# Patient Record
Sex: Female | Born: 1940
Health system: Southern US, Community
[De-identification: ages and names within clinical notes are randomized; demographics above are authoritative.]

## PROBLEM LIST (undated history)

## (undated) DIAGNOSIS — I351 Nonrheumatic aortic (valve) insufficiency: Secondary | ICD-10-CM

## (undated) DIAGNOSIS — M810 Age-related osteoporosis without current pathological fracture: Secondary | ICD-10-CM

## (undated) DIAGNOSIS — E785 Hyperlipidemia, unspecified: Secondary | ICD-10-CM

## (undated) DIAGNOSIS — K5790 Diverticulosis of intestine, part unspecified, without perforation or abscess without bleeding: Secondary | ICD-10-CM

## (undated) DIAGNOSIS — N814 Uterovaginal prolapse, unspecified: Secondary | ICD-10-CM

## (undated) DIAGNOSIS — E7800 Pure hypercholesterolemia, unspecified: Secondary | ICD-10-CM

## (undated) DIAGNOSIS — I10 Essential (primary) hypertension: Secondary | ICD-10-CM

## (undated) HISTORY — PX: ABDOMINAL HYSTERECTOMY: SHX81

## (undated) HISTORY — DX: Hyperlipidemia, unspecified: E78.5

## (undated) HISTORY — DX: Nonrheumatic aortic (valve) insufficiency: I35.1

## (undated) HISTORY — DX: Uterovaginal prolapse, unspecified: N81.4

## (undated) HISTORY — DX: Diverticulosis of intestine, part unspecified, without perforation or abscess without bleeding: K57.90

## (undated) HISTORY — DX: Age-related osteoporosis without current pathological fracture: M81.0

## (undated) HISTORY — DX: Essential (primary) hypertension: I10

## (undated) HISTORY — PX: BACK SURGERY: SHX140

---

## 2005-01-21 ENCOUNTER — Ambulatory Visit: Payer: Self-pay | Admitting: Gastroenterology

## 2005-02-10 ENCOUNTER — Ambulatory Visit: Payer: Self-pay | Admitting: Gastroenterology

## 2005-02-21 ENCOUNTER — Encounter: Admission: RE | Admit: 2005-02-21 | Discharge: 2005-02-21 | Payer: Self-pay | Admitting: Family Medicine

## 2005-05-30 ENCOUNTER — Ambulatory Visit (HOSPITAL_COMMUNITY): Admission: RE | Admit: 2005-05-30 | Discharge: 2005-05-31 | Payer: Self-pay | Admitting: Obstetrics and Gynecology

## 2006-04-14 ENCOUNTER — Encounter: Admission: RE | Admit: 2006-04-14 | Discharge: 2006-04-14 | Payer: Self-pay | Admitting: Family Medicine

## 2006-04-30 ENCOUNTER — Encounter: Admission: RE | Admit: 2006-04-30 | Discharge: 2006-04-30 | Payer: Self-pay | Admitting: Family Medicine

## 2006-07-12 ENCOUNTER — Inpatient Hospital Stay (HOSPITAL_COMMUNITY): Admission: RE | Admit: 2006-07-12 | Discharge: 2006-07-15 | Payer: Self-pay | Admitting: Orthopaedic Surgery

## 2010-04-18 ENCOUNTER — Encounter: Payer: Self-pay | Admitting: Family Medicine

## 2010-04-19 ENCOUNTER — Encounter: Payer: Self-pay | Admitting: Family Medicine

## 2010-12-05 ENCOUNTER — Emergency Department (HOSPITAL_COMMUNITY)
Admission: EM | Admit: 2010-12-05 | Discharge: 2010-12-05 | Disposition: A | Discharge status: Home / Self Care | Payer: PRIVATE HEALTH INSURANCE | Attending: Emergency Medicine | Admitting: Emergency Medicine

## 2010-12-05 DIAGNOSIS — E785 Hyperlipidemia, unspecified: Secondary | ICD-10-CM | POA: Insufficient documentation

## 2010-12-05 DIAGNOSIS — R112 Nausea with vomiting, unspecified: Secondary | ICD-10-CM | POA: Insufficient documentation

## 2010-12-05 DIAGNOSIS — I10 Essential (primary) hypertension: Secondary | ICD-10-CM | POA: Insufficient documentation

## 2010-12-05 DIAGNOSIS — R42 Dizziness and giddiness: Secondary | ICD-10-CM | POA: Insufficient documentation

## 2010-12-05 DIAGNOSIS — R197 Diarrhea, unspecified: Secondary | ICD-10-CM | POA: Insufficient documentation

## 2010-12-05 DIAGNOSIS — K5289 Other specified noninfective gastroenteritis and colitis: Secondary | ICD-10-CM | POA: Insufficient documentation

## 2010-12-05 LAB — CBC
HCT: 33 % — ABNORMAL LOW (ref 36.0–46.0)
Hemoglobin: 11.4 g/dL — ABNORMAL LOW (ref 12.0–15.0)
MCH: 29.6 pg (ref 26.0–34.0)
MCHC: 34.5 g/dL (ref 30.0–36.0)
MCV: 85.7 fL (ref 78.0–100.0)
RDW: 14.2 % (ref 11.5–15.5)
WBC: 9.8 10*3/uL (ref 4.0–10.5)

## 2010-12-05 LAB — COMPREHENSIVE METABOLIC PANEL
ALT: 29 U/L (ref 0–35)
Albumin: 4.4 g/dL (ref 3.5–5.2)
Alkaline Phosphatase: 71 U/L (ref 39–117)
BUN: 16 mg/dL (ref 6–23)
CO2: 28 mEq/L (ref 19–32)
Calcium: 9.7 mg/dL (ref 8.4–10.5)
Chloride: 105 mEq/L (ref 96–112)
Creatinine, Ser: 0.65 mg/dL (ref 0.50–1.10)
GFR calc Af Amer: >60 mL/min (ref >60)
GFR calc non Af Amer: >60 mL/min (ref >60)
Glucose, Bld: 116 mg/dL — ABNORMAL HIGH (ref 70–99)
Total Bilirubin: 0.5 mg/dL (ref 0.3–1.2)
Total Protein: 7.1 g/dL (ref 6.0–8.3)

## 2010-12-05 LAB — DIFFERENTIAL
Basophils Absolute: 0 10*3/uL (ref 0.0–0.1)
Eosinophils Absolute: 0 10*3/uL (ref 0.0–0.7)
Eosinophils Relative: 0 % (ref 0–5)
Lymphocytes Relative: 8 % — ABNORMAL LOW (ref 12–46)
Lymphs Abs: 0.8 10*3/uL (ref 0.7–4.0)
Monocytes Absolute: 0.4 10*3/uL (ref 0.1–1.0)
Monocytes Relative: 4 % (ref 3–12)
Neutro Abs: 8.5 10*3/uL — ABNORMAL HIGH (ref 1.7–7.7)

## 2010-12-05 LAB — URINALYSIS, ROUTINE W REFLEX MICROSCOPIC
Bilirubin Urine: NEGATIVE
Glucose, UA: NEGATIVE mg/dL
Hgb urine dipstick: NEGATIVE
Ketones, ur: 15 mg/dL — AB
Nitrite: NEGATIVE
Protein, ur: NEGATIVE mg/dL
Specific Gravity, Urine: 1.016 (ref 1.005–1.030)
pH: 6.5 (ref 5.0–8.0)

## 2010-12-05 LAB — LIPASE, BLOOD: Lipase: 36 U/L (ref 11–59)

## 2010-12-07 LAB — URINE CULTURE
Colony Count: 70000
Culture  Setup Time: 201209092007

## 2012-07-12 ENCOUNTER — Other Ambulatory Visit: Payer: Self-pay | Admitting: Physician Assistant

## 2012-08-17 ENCOUNTER — Other Ambulatory Visit: Payer: Self-pay | Admitting: Family Medicine

## 2012-09-04 ENCOUNTER — Other Ambulatory Visit: Payer: Self-pay | Admitting: Family Medicine

## 2012-11-22 ENCOUNTER — Ambulatory Visit
Admission: RE | Admit: 2012-11-22 | Discharge: 2012-11-22 | Disposition: A | Discharge status: Home / Self Care | Payer: PRIVATE HEALTH INSURANCE | Source: Ambulatory Visit | Attending: Family Medicine | Admitting: Family Medicine

## 2012-11-22 ENCOUNTER — Ambulatory Visit (INDEPENDENT_AMBULATORY_CARE_PROVIDER_SITE_OTHER): Payer: PRIVATE HEALTH INSURANCE | Admitting: Family Medicine

## 2012-11-22 ENCOUNTER — Encounter: Payer: Self-pay | Admitting: Family Medicine

## 2012-11-22 VITALS — BP 100/52 | HR 68 | Temp 98.1°F | Resp 18 | Wt 132.0 lb

## 2012-11-22 DIAGNOSIS — M810 Age-related osteoporosis without current pathological fracture: Secondary | ICD-10-CM | POA: Insufficient documentation

## 2012-11-22 DIAGNOSIS — R918 Other nonspecific abnormal finding of lung field: Secondary | ICD-10-CM

## 2012-11-22 DIAGNOSIS — K5790 Diverticulosis of intestine, part unspecified, without perforation or abscess without bleeding: Secondary | ICD-10-CM | POA: Insufficient documentation

## 2012-11-22 DIAGNOSIS — I10 Essential (primary) hypertension: Secondary | ICD-10-CM

## 2012-11-22 DIAGNOSIS — E785 Hyperlipidemia, unspecified: Secondary | ICD-10-CM | POA: Insufficient documentation

## 2012-11-22 DIAGNOSIS — N814 Uterovaginal prolapse, unspecified: Secondary | ICD-10-CM | POA: Insufficient documentation

## 2012-11-22 LAB — CBC WITH DIFFERENTIAL/PLATELET
Basophils Absolute: 0 10*3/uL (ref 0.0–0.1)
Basophils Relative: 1 % (ref 0–1)
Eosinophils Relative: 2 % (ref 0–5)
HCT: 34.7 % — ABNORMAL LOW (ref 36.0–46.0)
Hemoglobin: 11.5 g/dL — ABNORMAL LOW (ref 12.0–15.0)
Lymphocytes Relative: 27 % (ref 12–46)
Lymphs Abs: 1.3 10*3/uL (ref 0.7–4.0)
MCHC: 33.1 g/dL (ref 30.0–36.0)
MCV: 89.2 fL (ref 78.0–100.0)
Monocytes Absolute: 0.4 10*3/uL (ref 0.1–1.0)
Monocytes Relative: 8 % (ref 3–12)
Neutro Abs: 2.9 10*3/uL (ref 1.7–7.7)
Platelets: 236 10*3/uL (ref 150–400)
RBC: 3.89 MIL/uL (ref 3.87–5.11)
RDW: 14.2 % (ref 11.5–15.5)

## 2012-11-22 LAB — COMPLETE METABOLIC PANEL WITH GFR
AST: 18 U/L (ref 0–37)
Albumin: 4.5 g/dL (ref 3.5–5.2)
Alkaline Phosphatase: 72 U/L (ref 39–117)
BUN: 14 mg/dL (ref 6–23)
CO2: 28 mEq/L (ref 19–32)
Calcium: 9.8 mg/dL (ref 8.4–10.5)
Chloride: 106 mEq/L (ref 96–112)
Creat: 0.97 mg/dL (ref 0.50–1.10)
GFR, Est Non African American: 59 mL/min — ABNORMAL LOW
Glucose, Bld: 92 mg/dL (ref 70–99)
Potassium: 5 mEq/L (ref 3.5–5.3)
Sodium: 141 mEq/L (ref 135–145)
Total Bilirubin: 0.8 mg/dL (ref 0.3–1.2)
Total Protein: 6.8 g/dL (ref 6.0–8.3)

## 2012-11-22 LAB — LIPID PANEL
Cholesterol: 137 mg/dL (ref 0–200)
LDL Cholesterol: 59 mg/dL (ref 0–99)
Total CHOL/HDL Ratio: 2.3 Ratio
Triglycerides: 92 mg/dL (ref <150)
VLDL: 18 mg/dL (ref 0–40)

## 2012-11-22 MED ORDER — BENZONATATE 100 MG PO CAPS
100.0000 mg | ORAL_CAPSULE | Freq: Two times a day (BID) | ORAL | Status: DC | PRN
Start: 2012-11-22 — End: 2014-02-17

## 2012-11-22 NOTE — Progress Notes (Signed)
Subjective:    Patient ID: Julia Robinson, female    DOB: May 21, 1940, 72 y.o.   MRN: [id number]  HPI This is in for followup of her hypertension. She is currently taking diltiazem 120 mg by mouth daily, Cozaar 50 mg by mouth daily, and Lasix 20 mg by mouth when necessary. She denies any chest pain, shortness of breath, dyspnea on exertion. She has hyperlipidemia. She is to lovastatin 20 mg daily. She denies any myalgia or right upper quadrant pain. She does report an occasional cough. Past Medical History  Diagnosis Date  . Hyperlipidemia   . Hypertension   . Diverticulosis   . Osteoporosis   . Prolapsed uterus    Past Surgical History  Procedure Laterality Date  . Abdominal hysterectomy      A+P repair 2007   History   Social History  . Marital Status: Married    Spouse Name: N/A    Number of Children: N/A  . Years of Education: N/A   Occupational History  . Not on file.   Social History Main Topics  . Smoking status: Never Smoker   . Smokeless tobacco: Not on file  . Alcohol Use: No  . Drug Use: No  . Sexual Activity: Not on file   Other Topics Concern  . Not on file   Social History Narrative  . No narrative on file   Family History  Problem Relation Age of Onset  . Heart disease Mother   . Diabetes Sister   . Hypertension Sister   . Hyperlipidemia Sister   . Cancer Maternal Aunt     lung   Current Outpatient Prescriptions on File Prior to Visit  Medication Sig Dispense Refill  . diltiazem (CARDIZEM) 120 MG tablet TAKE ONE TABLET BY MOUTH EVERY DAY  90 tablet  1  . losartan (COZAAR) 50 MG tablet TAKE ONE TABLET BY MOUTH EVERY DAY  30 tablet  5  . piroxicam (FELDENE) 20 MG capsule TAKE ONE CAPSULE BY MOUTH EVERY DAY AS NEEDED FOR PAIN  30 capsule  3   No current facility-administered medications on file prior to visit.   Allergies  Allergen Reactions  . Ace Inhibitors     Cough  . Amlodipine     Swelling  . Codeine   . Crestor [Rosuvastatin]   .  Dramamine [Dimenhydrinate]   . Lipitor [Atorvastatin]     Constipation  . Quinine Derivatives     Hives  . Sulfa Antibiotics       Review of Systems  All other systems reviewed and are negative.       Objective:   Physical Exam  Vitals reviewed. Constitutional: She appears well-developed and well-nourished.  Cardiovascular: Normal rate, regular rhythm, normal heart sounds and intact distal pulses.  Exam reveals no gallop and no friction rub.   No murmur heard. Pulmonary/Chest: Effort normal. No respiratory distress. She has no wheezes. She has rales (RLL). She exhibits no tenderness.  Abdominal: Soft. Bowel sounds are normal. She exhibits no distension and no mass. There is no tenderness. There is no rebound and no guarding.  Musculoskeletal: She exhibits no edema and no tenderness.  Skin: Skin is warm. No rash noted. No erythema. No pallor.          Assessment & Plan:  1. HLD (hyperlipidemia) Check fasting lipid panel. Goal LDL is less than 130. - CBC with Differential - COMPLETE METABOLIC PANEL WITH GFR - Lipid panel  2. Lung field abnormal finding on examination  Given her cough and abnormal finding on her pulmonary examination, I will obtain a chest x-ray. - DG Chest 2 View; Future  3. HTN (hypertension) Blood pressure is excellent. Continue current medications at the present dosages.

## 2013-01-08 ENCOUNTER — Ambulatory Visit (INDEPENDENT_AMBULATORY_CARE_PROVIDER_SITE_OTHER): Payer: PRIVATE HEALTH INSURANCE | Admitting: Family Medicine

## 2013-01-08 DIAGNOSIS — Z23 Encounter for immunization: Secondary | ICD-10-CM

## 2013-02-11 ENCOUNTER — Other Ambulatory Visit: Payer: Self-pay | Admitting: Family Medicine

## 2013-02-25 ENCOUNTER — Other Ambulatory Visit: Payer: Self-pay | Admitting: Family Medicine

## 2013-02-25 NOTE — Telephone Encounter (Signed)
Medication refilled per protocol. 

## 2013-03-12 ENCOUNTER — Other Ambulatory Visit: Payer: Self-pay | Admitting: Family Medicine

## 2013-04-01 ENCOUNTER — Encounter: Payer: Self-pay | Admitting: Family Medicine

## 2013-04-01 ENCOUNTER — Ambulatory Visit (INDEPENDENT_AMBULATORY_CARE_PROVIDER_SITE_OTHER): Payer: Medicare HMO | Admitting: Family Medicine

## 2013-04-01 VITALS — BP 110/64 | HR 74 | Temp 97.2°F | Resp 16 | Wt 130.0 lb

## 2013-04-01 DIAGNOSIS — I10 Essential (primary) hypertension: Secondary | ICD-10-CM

## 2013-04-01 DIAGNOSIS — E785 Hyperlipidemia, unspecified: Secondary | ICD-10-CM

## 2013-04-01 NOTE — Progress Notes (Signed)
Subjective:    Patient ID: Julia Robinson, female    DOB: 12/20/1940, 73 y.o.   MRN: [id number]  HPI  Patient has a history of hypertension and hyperlipidemia. She is currently on diltiazem and losartan for her blood pressure along with Mevacor for cholesterol. She denies any chest pain, shortness of breath, dyspnea on exertion. She denies any myalgias or right upper quadrant pain. She is overdue for fasting lipid panel as well as a CMP to evaluate her liver and her kidneys. She also reports intermittent fever over the last 2 weeks. Her husband has recently been battling the flu. However her fevers predate that. Today she feels well. She is not having a fever. She has no other signs or symptoms of systemic illness. Past Medical History  Diagnosis Date  . Hyperlipidemia   . Hypertension   . Diverticulosis   . Osteoporosis   . Prolapsed uterus    Current Outpatient Prescriptions on File Prior to Visit  Medication Sig Dispense Refill  . aspirin 81 MG tablet Take 81 mg by mouth daily.      . benzonatate (TESSALON) 100 MG capsule Take 1 capsule (100 mg total) by mouth 2 (two) times daily as needed for cough.  30 capsule  0  . diltiazem (CARDIZEM) 120 MG tablet TAKE ONE TABLET BY MOUTH ONCE DAILY  90 tablet  0  . furosemide (LASIX) 20 MG tablet Take 20 mg by mouth as needed.      Marland Kitchen. losartan (COZAAR) 50 MG tablet TAKE ONE TABLET BY MOUTH ONCE DAILY  30 tablet  11  . lovastatin (MEVACOR) 20 MG tablet Take 20 mg by mouth at bedtime.      . piroxicam (FELDENE) 20 MG capsule TAKE ONE CAPSULE BY MOUTH ONCE DAILY AS NEEDED FOR PAIN  30 capsule  0   No current facility-administered medications on file prior to visit.   Allergies  Allergen Reactions  . Ace Inhibitors     Cough  . Amlodipine     Swelling  . Codeine   . Crestor [Rosuvastatin]   . Dramamine [Dimenhydrinate]   . Lipitor [Atorvastatin]     Constipation  . Quinine Derivatives     Hives  . Sulfa Antibiotics    History    Social History  . Marital Status: Married    Spouse Name: N/A    Number of Children: N/A  . Years of Education: N/A   Occupational History  . Not on file.   Social History Main Topics  . Smoking status: Never Smoker   . Smokeless tobacco: Not on file  . Alcohol Use: No  . Drug Use: No  . Sexual Activity: Not on file   Other Topics Concern  . Not on file   Social History Narrative  . No narrative on file     Review of Systems  All other systems reviewed and are negative.       Objective:   Physical Exam  Vitals reviewed. Neck: Neck supple. No JVD present. No thyromegaly present.  Cardiovascular: Normal rate and regular rhythm.  Exam reveals no gallop and no friction rub.   No murmur heard. Pulmonary/Chest: Effort normal and breath sounds normal. No respiratory distress. She has no wheezes. She has no rales. She exhibits no tenderness.  Abdominal: Soft. Bowel sounds are normal. She exhibits no distension and no mass. There is no tenderness. There is no rebound and no guarding.  Musculoskeletal: Normal range of motion. She exhibits no edema.  Lymphadenopathy:    She has no cervical adenopathy.  Neurological: She has normal reflexes. She displays normal reflexes. She exhibits normal muscle tone. Coordination normal.  Skin: Skin is warm and dry. No rash noted. No erythema. No pallor.          Assessment & Plan:  1. HLD (hyperlipidemia) Check fasting lipid panel. LDL is less than 130. - COMPLETE METABOLIC PANEL WITH GFR - Lipid panel - CBC with Differential  2. HTN (hypertension) Pressures currently under control. Continue current medications at the present dosages. Given the subjective intermittent fevers I will also check a CBC. If the patient does have an elevated white blood cell count, I would begin with a workup including a urinalysis, blood cultures, chest x-ray, etc.   Also recommended when the patient feels better to return for Prevnar 13.

## 2013-04-02 ENCOUNTER — Encounter: Payer: Self-pay | Admitting: *Deleted

## 2013-04-02 LAB — CBC WITH DIFFERENTIAL/PLATELET
BASOS PCT: 0 % (ref 0–1)
Basophils Absolute: 0 10*3/uL (ref 0.0–0.1)
EOS PCT: 1 % (ref 0–5)
Eosinophils Absolute: 0.1 10*3/uL (ref 0.0–0.7)
HCT: 37.5 % (ref 36.0–46.0)
Hemoglobin: 12.8 g/dL (ref 12.0–15.0)
Lymphocytes Relative: 28 % (ref 12–46)
Lymphs Abs: 1.5 10*3/uL (ref 0.7–4.0)
MCH: 30.1 pg (ref 26.0–34.0)
MCHC: 34.1 g/dL (ref 30.0–36.0)
MCV: 88.2 fL (ref 78.0–100.0)
Monocytes Absolute: 0.4 10*3/uL (ref 0.1–1.0)
Monocytes Relative: 8 % (ref 3–12)
NEUTROS ABS: 3.2 10*3/uL (ref 1.7–7.7)
Neutrophils Relative %: 63 % (ref 43–77)
Platelets: 268 10*3/uL (ref 150–400)
RBC: 4.25 MIL/uL (ref 3.87–5.11)
RDW: 13.9 % (ref 11.5–15.5)
WBC: 5.2 10*3/uL (ref 4.0–10.5)

## 2013-04-02 LAB — LIPID PANEL
Cholesterol: 163 mg/dL (ref 0–200)
HDL: 65 mg/dL (ref >39)
LDL CALC: 77 mg/dL (ref 0–99)
Total CHOL/HDL Ratio: 2.5 Ratio
Triglycerides: 104 mg/dL (ref <150)
VLDL: 21 mg/dL (ref 0–40)

## 2013-04-02 LAB — COMPLETE METABOLIC PANEL WITH GFR
ALT: 17 U/L (ref 0–35)
AST: 20 U/L (ref 0–37)
Albumin: 4.5 g/dL (ref 3.5–5.2)
Alkaline Phosphatase: 68 U/L (ref 39–117)
BUN: 11 mg/dL (ref 6–23)
CO2: 29 mEq/L (ref 19–32)
Calcium: 9.3 mg/dL (ref 8.4–10.5)
Chloride: 101 mEq/L (ref 96–112)
Creat: 0.68 mg/dL (ref 0.50–1.10)
GFR, Est African American: >89 mL/min
GFR, Est Non African American: 89 mL/min
Glucose, Bld: 88 mg/dL (ref 70–99)
Potassium: 3.5 mEq/L (ref 3.5–5.3)
SODIUM: 141 meq/L (ref 135–145)
TOTAL PROTEIN: 6.7 g/dL (ref 6.0–8.3)
Total Bilirubin: 0.9 mg/dL (ref 0.3–1.2)

## 2013-04-05 ENCOUNTER — Other Ambulatory Visit: Payer: Self-pay | Admitting: Family Medicine

## 2013-04-05 MED ORDER — LOSARTAN POTASSIUM 50 MG PO TABS: ORAL_TABLET | ORAL | Status: DC

## 2013-04-05 MED ORDER — DILTIAZEM HCL 120 MG PO TABS: ORAL_TABLET | ORAL | Status: DC

## 2013-04-05 MED ORDER — LOVASTATIN 20 MG PO TABS: 20.0000 mg | ORAL_TABLET | Freq: Every day | ORAL | Status: DC

## 2013-06-18 ENCOUNTER — Telehealth: Payer: Self-pay | Admitting: *Deleted

## 2013-06-18 MED ORDER — DOXAZOSIN MESYLATE 2 MG PO TABS: 2.0000 mg | ORAL_TABLET | Freq: Every day | ORAL | Status: DC

## 2013-06-18 NOTE — Telephone Encounter (Signed)
Pt states she has been taking Losartan 50mg  and since taking it she has developed muscle cramps, leg cramps, feet hurt and has not been able to sleep good, pt has voluntarily stopped taking losartan and wants to know if you can put her on something else? SHe wanted to remind you that she can not take lisinopril either.

## 2013-06-18 NOTE — Telephone Encounter (Signed)
Stop losartan and start cardura 2 mg poqday.  Recheck BP in 1 month

## 2013-06-18 NOTE — Telephone Encounter (Signed)
Pt is aware of change in medication and script has been sent to pts pharmacy

## 2013-06-25 ENCOUNTER — Telehealth: Payer: Self-pay | Admitting: *Deleted

## 2013-06-25 NOTE — Telephone Encounter (Signed)
Quit losartan and doxazosin and try her on samples of benicar 40 mg poqday

## 2013-06-25 NOTE — Telephone Encounter (Signed)
Pt called stating that since taking medication Doxazosin 2mg , she has been having blurry vision, lighheadness, and dizziness, pt stopped taking the medication but now feels nervous. Pt wants to know if you can prescribe her something else and would like a sample first before picking up script if possible. Pt says she will not take the medication unless you tell her differently.Please advise!

## 2013-06-28 NOTE — Telephone Encounter (Signed)
Patient aware and samples left up front 

## 2013-07-03 ENCOUNTER — Other Ambulatory Visit: Payer: Self-pay | Admitting: Family Medicine

## 2013-07-03 MED ORDER — OLMESARTAN MEDOXOMIL 40 MG PO TABS: 40.0000 mg | ORAL_TABLET | Freq: Every day | ORAL | Status: DC

## 2013-07-09 ENCOUNTER — Telehealth: Payer: Self-pay | Admitting: Family Medicine

## 2013-07-09 MED ORDER — CLONIDINE HCL 0.1 MG PO TABS: 0.1000 mg | ORAL_TABLET | Freq: Two times a day (BID) | ORAL | Status: DC

## 2013-07-09 MED ORDER — VALSARTAN 320 MG PO TABS: 320.0000 mg | ORAL_TABLET | Freq: Every day | ORAL | Status: DC

## 2013-07-09 NOTE — Telephone Encounter (Signed)
Med sent to pharm and pt aware 

## 2013-07-09 NOTE — Telephone Encounter (Signed)
Message copied by Ricard DillonWILLIS, SANDY B on Tue Jul 09, 2013  4:09 PM ------      Message from: [name], [name]      Created: Tue Jul 09, 2013  1:57 PM       Switch to diovan 320 mg poqday      ----- Message -----         From: Ricard DillonSandy B Willis         Sent: 07/09/2013  12:48 PM           To: Donita BrooksWarren T Pickard, MD                        ----- Message -----         From: [name]         Sent: 07/09/2013  11:31 AM           To: Ricard DillonSandy B Willis            Patient is calling to say she cannot afford the benicar we prescribed could we prescribe something else? [phone]       ------

## 2013-07-09 NOTE — Telephone Encounter (Signed)
Message copied by Ricard DillonWILLIS, SANDY B on Tue Jul 09, 2013  5:04 PM ------      Message from: Harriet MassonOBERTS, CHELSEA N      Created: Tue Jul 09, 2013  4:16 PM      Regarding: RX issue      Contact: [phone]       PT is needing to speak to you about her prescription and the cost of it ------

## 2013-07-09 NOTE — Telephone Encounter (Signed)
Message copied by Ricard DillonWILLIS, Dare Sanger B on Tue Jul 09, 2013  3:10 PM ------      Message from: [name], [name]      Created: Tue Jul 09, 2013  1:57 PM       Switch to diovan 320 mg poqday      ----- Message -----         From: Ricard DillonSandy B Aeon Koors         Sent: 07/09/2013  12:48 PM           To: Donita BrooksWarren T Pickard, MD                        ----- Message -----         From: [name]         Sent: 07/09/2013  11:31 AM           To: [name]            Patient is calling to say she cannot afford the benicar we prescribed could we prescribe something else? [phone]       ------

## 2013-07-09 NOTE — Telephone Encounter (Signed)
Pt states that the Diovan is also too expensive, can we call in something else. Per Dr. Pickard can Tanya Nonesdo Clonidine .1mg  bid - pt aware and med sent to pharm

## 2013-09-10 ENCOUNTER — Telehealth: Payer: Self-pay | Admitting: *Deleted

## 2013-09-10 NOTE — Telephone Encounter (Signed)
Submitted [employer] referral thru [employer] for authorization, authorization number is [id number], authorization was faxed to Dr. Dione BoozeGroat office at [phone].

## 2013-09-19 ENCOUNTER — Encounter: Payer: Self-pay | Admitting: Gastroenterology

## 2013-09-19 ENCOUNTER — Ambulatory Visit: Payer: Medicare HMO | Admitting: Family Medicine

## 2013-12-25 ENCOUNTER — Ambulatory Visit (INDEPENDENT_AMBULATORY_CARE_PROVIDER_SITE_OTHER): Payer: Medicare HMO | Admitting: *Deleted

## 2013-12-25 ENCOUNTER — Telehealth: Payer: Self-pay | Admitting: Family Medicine

## 2013-12-25 DIAGNOSIS — Z23 Encounter for immunization: Secondary | ICD-10-CM

## 2013-12-25 NOTE — Telephone Encounter (Signed)
Patient should not be taking doxazosin. Medication was d/c'ed on 3/24 d/t side effects.  Patient should be taking clonidine 0.1mg .   Call placed to patient to inquire. No answer, no VM.

## 2013-12-25 NOTE — Telephone Encounter (Signed)
[phone]  PT had let her prescription Doxazosin 2 mg tab expire and she would like to have a refill [facility]

## 2014-01-03 MED ORDER — DOXAZOSIN MESYLATE 2 MG PO TABS: 2.0000 mg | ORAL_TABLET | Freq: Every day | ORAL | Status: DC

## 2014-01-03 NOTE — Telephone Encounter (Signed)
Pt states that they thought the Doxazosin was causing her hand/finger pain but later found out it was just a trigger finger and she went back to taking Doxazosin and would like a new rx sent to [facility]. Per Dr. Tanya NonesPickard ok and med sent to pharm.

## 2014-02-17 ENCOUNTER — Ambulatory Visit (INDEPENDENT_AMBULATORY_CARE_PROVIDER_SITE_OTHER): Payer: Medicare HMO | Admitting: Family Medicine

## 2014-02-17 ENCOUNTER — Encounter: Payer: Self-pay | Admitting: Family Medicine

## 2014-02-17 VITALS — BP 160/76 | HR 84 | Temp 97.8°F | Resp 18 | Wt 119.0 lb

## 2014-02-17 DIAGNOSIS — E785 Hyperlipidemia, unspecified: Secondary | ICD-10-CM

## 2014-02-17 DIAGNOSIS — J019 Acute sinusitis, unspecified: Secondary | ICD-10-CM

## 2014-02-17 LAB — COMPLETE METABOLIC PANEL WITH GFR
ALT: 13 U/L (ref 0–35)
AST: 17 U/L (ref 0–37)
Albumin: 4.1 g/dL (ref 3.5–5.2)
Alkaline Phosphatase: 80 U/L (ref 39–117)
BUN: 16 mg/dL (ref 6–23)
CO2: 26 mEq/L (ref 19–32)
Calcium: 9.5 mg/dL (ref 8.4–10.5)
Chloride: 105 mEq/L (ref 96–112)
Creat: 0.82 mg/dL (ref 0.50–1.10)
GFR, EST NON AFRICAN AMERICAN: 72 mL/min
GFR, Est African American: 83 mL/min
GLUCOSE: 91 mg/dL (ref 70–99)
Potassium: 3.6 mEq/L (ref 3.5–5.3)
Sodium: 143 mEq/L (ref 135–145)
TOTAL PROTEIN: 6.5 g/dL (ref 6.0–8.3)
Total Bilirubin: 0.4 mg/dL (ref 0.2–1.2)

## 2014-02-17 LAB — LIPID PANEL
CHOLESTEROL: 161 mg/dL (ref 0–200)
HDL: 66 mg/dL (ref >39)
LDL Cholesterol: 79 mg/dL (ref 0–99)
Total CHOL/HDL Ratio: 2.4 Ratio
Triglycerides: 79 mg/dL (ref <150)
VLDL: 16 mg/dL (ref 0–40)

## 2014-02-17 MED ORDER — FLUCONAZOLE 150 MG PO TABS: 150.0000 mg | ORAL_TABLET | Freq: Once | ORAL | Status: DC

## 2014-02-17 MED ORDER — AMOXICILLIN-POT CLAVULANATE 875-125 MG PO TABS: 1.0000 | ORAL_TABLET | Freq: Two times a day (BID) | ORAL | Status: DC

## 2014-02-17 MED ORDER — BENZONATATE 100 MG PO CAPS
100.0000 mg | ORAL_CAPSULE | Freq: Two times a day (BID) | ORAL | Status: DC | PRN
Start: 2014-02-17 — End: 2014-09-18

## 2014-02-17 NOTE — Progress Notes (Signed)
Subjective:    Patient ID: Julia Robinson, female    DOB: 05/29/1940, 73 y.o.   MRN: [id number]  HPI Patient is a very pleasant 73 year old white female who has been battling a respiratory infection now for 2-1/2 weeks. Her cough has improved slightly but now she is having pain and pressure and congestion behind both of her eyes and in her maxillary sinuses. She reports postnasal drip. She reports headache and head congestion for more than 7 days. The cough is nonproductive. The cough is mainly due to postnasal drip. She denies any shortness of breath or chest pain. She denies any hemoptysis or epistaxis. Past Medical History  Diagnosis Date  . Hyperlipidemia   . Hypertension   . Diverticulosis   . Osteoporosis   . Prolapsed uterus    Past Surgical History  Procedure Laterality Date  . Abdominal hysterectomy      A+P repair 2007   Current Outpatient Prescriptions on File Prior to Visit  Medication Sig Dispense Refill  . aspirin 81 MG tablet Take 81 mg by mouth daily.    . cloNIDine (CATAPRES) 0.1 MG tablet Take 1 tablet (0.1 mg total) by mouth 2 (two) times daily. 180 tablet 3  . diltiazem (CARDIZEM) 120 MG tablet TAKE ONE TABLET BY MOUTH ONCE DAILY 90 tablet 4  . doxazosin (CARDURA) 2 MG tablet Take 1 tablet (2 mg total) by mouth daily. 90 tablet 3  . furosemide (LASIX) 20 MG tablet Take 20 mg by mouth as needed.    . lovastatin (MEVACOR) 20 MG tablet Take 1 tablet (20 mg total) by mouth at bedtime. 90 tablet 4  . piroxicam (FELDENE) 20 MG capsule TAKE ONE CAPSULE BY MOUTH ONCE DAILY AS NEEDED FOR PAIN 30 capsule 0   No current facility-administered medications on file prior to visit.   Allergies  Allergen Reactions  . Ace Inhibitors     Cough  . Amlodipine     Swelling  . Codeine   . Crestor [Rosuvastatin]   . Dramamine [Dimenhydrinate]   . Lipitor [Atorvastatin]     Constipation  . Quinine Derivatives     Hives  . Sulfa Antibiotics    History   Social History    . Marital Status: Married    Spouse Name: N/A    Number of Children: N/A  . Years of Education: N/A   Occupational History  . Not on file.   Social History Main Topics  . Smoking status: Never Smoker   . Smokeless tobacco: Not on file  . Alcohol Use: No  . Drug Use: No  . Sexual Activity: Not on file   Other Topics Concern  . Not on file   Social History Narrative      Review of Systems  All other systems reviewed and are negative.      Objective:   Physical Exam  Constitutional: She appears well-developed and well-nourished. No distress.  HENT:  Right Ear: Tympanic membrane, external ear and ear canal normal.  Left Ear: Tympanic membrane, external ear and ear canal normal.  Nose: Mucosal edema and rhinorrhea present. Right sinus exhibits maxillary sinus tenderness. Left sinus exhibits maxillary sinus tenderness.  Mouth/Throat: Oropharynx is clear and moist. No oropharyngeal exudate.  Neck: Neck supple.  Cardiovascular: Normal rate, regular rhythm and normal heart sounds.   Pulmonary/Chest: Effort normal and breath sounds normal. No respiratory distress. She has no wheezes. She has no rales.  Lymphadenopathy:    She has no cervical adenopathy.  Skin: She is not diaphoretic.  Vitals reviewed.         Assessment & Plan:  Acute rhinosinusitis - Plan: amoxicillin-clavulanate (AUGMENTIN) 875-125 MG per tablet, benzonatate (TESSALON) 100 MG capsule  HLD (hyperlipidemia) - Plan: COMPLETE METABOLIC PANEL WITH GFR, Lipid panel  I'll treat the patient for a sinus infection with Augmentin 875 mg by mouth twice a day for 10 days. Also gave her Jerilynn Somessalon Perles to take as needed for her cough. Patient's husband has recently had C. Difficile colitis. If she develops diarrhea we will need to check her immediately. I did give her a prescription for Diflucan in case she develops a yeast infection. While she is here also to check her cholesterol. Her blood pressure is elevated  today due to Sudafed which she has been taking at home. Her blood pressures typically well controlled otherwise.

## 2014-04-28 ENCOUNTER — Ambulatory Visit (INDEPENDENT_AMBULATORY_CARE_PROVIDER_SITE_OTHER): Payer: Medicare HMO | Admitting: Family Medicine

## 2014-04-28 ENCOUNTER — Other Ambulatory Visit: Payer: Self-pay | Admitting: Family Medicine

## 2014-04-28 ENCOUNTER — Encounter: Payer: Self-pay | Admitting: Family Medicine

## 2014-04-28 VITALS — BP 122/68 | HR 82 | Temp 97.9°F | Resp 16 | Wt 121.0 lb

## 2014-04-28 DIAGNOSIS — J208 Acute bronchitis due to other specified organisms: Secondary | ICD-10-CM

## 2014-04-28 MED ORDER — AZITHROMYCIN 250 MG PO TABS: ORAL_TABLET | ORAL | Status: DC

## 2014-04-28 NOTE — Progress Notes (Signed)
Subjective:    Patient ID: Julia BealsFrances M Risden, female    DOB: 05/04/1940, 74 y.o.   MRN: [id number]  HPI Patient has had one week of cough is productive of clear sputum. She denies any fevers or chills. She does have pleurisy in her posterior lower lobes bilaterally but she chooses to coughing so much. The cough is starting to improve. She denies any shortness of breath. She denies any purulent mucus. She denies any severe chest pain. She denies any hemoptysis. Past Medical History  Diagnosis Date  . Hyperlipidemia   . Hypertension   . Diverticulosis   . Osteoporosis   . Prolapsed uterus    Past Surgical History  Procedure Laterality Date  . Abdominal hysterectomy      A+P repair 2007   Current Outpatient Prescriptions on File Prior to Visit  Medication Sig Dispense Refill  . aspirin 81 MG tablet Take 81 mg by mouth daily.    . benzonatate (TESSALON) 100 MG capsule Take 1 capsule (100 mg total) by mouth 2 (two) times daily as needed for cough. 30 capsule 0  . cloNIDine (CATAPRES) 0.1 MG tablet Take 1 tablet (0.1 mg total) by mouth 2 (two) times daily. 180 tablet 3  . diltiazem (CARDIZEM) 120 MG tablet TAKE ONE TABLET BY MOUTH ONCE DAILY 90 tablet 4  . doxazosin (CARDURA) 2 MG tablet Take 1 tablet (2 mg total) by mouth daily. 90 tablet 3  . fluconazole (DIFLUCAN) 150 MG tablet Take 1 tablet (150 mg total) by mouth once. 1 tablet 0  . furosemide (LASIX) 20 MG tablet Take 20 mg by mouth as needed.    . lovastatin (MEVACOR) 20 MG tablet Take 1 tablet (20 mg total) by mouth at bedtime. 90 tablet 4  . piroxicam (FELDENE) 20 MG capsule TAKE ONE CAPSULE BY MOUTH ONCE DAILY AS NEEDED FOR PAIN 30 capsule 0   No current facility-administered medications on file prior to visit.   Allergies  Allergen Reactions  . Ace Inhibitors     Cough  . Amlodipine     Swelling  . Codeine   . Crestor [Rosuvastatin]   . Dramamine [Dimenhydrinate]   . Lipitor [Atorvastatin]     Constipation  . Quinine  Derivatives     Hives  . Sulfa Antibiotics    History   Social History  . Marital Status: Married    Spouse Name: N/A    Number of Children: N/A  . Years of Education: N/A   Occupational History  . Not on file.   Social History Main Topics  . Smoking status: Never Smoker   . Smokeless tobacco: Not on file  . Alcohol Use: No  . Drug Use: No  . Sexual Activity: Not on file   Other Topics Concern  . Not on file   Social History Narrative      Review of Systems  All other systems reviewed and are negative.      Objective:   Physical Exam  Constitutional: She appears well-developed and well-nourished.  HENT:  Right Ear: External ear normal.  Left Ear: External ear normal.  Nose: Nose normal.  Mouth/Throat: Oropharynx is clear and moist. No oropharyngeal exudate.  Eyes: Conjunctivae are normal.  Neck: Neck supple.  Cardiovascular: Normal rate, regular rhythm and normal heart sounds.   No murmur heard. Pulmonary/Chest: Effort normal and breath sounds normal. No respiratory distress. She has no wheezes. She has no rales. She exhibits no tenderness.  Lymphadenopathy:    She  has no cervical adenopathy.  Vitals reviewed.         Assessment & Plan:  Acute bronchitis due to other specified organisms - Plan: azithromycin (ZITHROMAX) 250 MG tablet  I believe the patient has viral bronchitis. I recommended continuing Mucinex. If her symptoms worsen I will the patient begin a Z-Pak particular she develops high fevers or purulent sputum. At the present time though I recommended just continue tincture of time. I anticipate spontaneous resolution over the next week

## 2014-05-08 ENCOUNTER — Telehealth: Payer: Self-pay | Admitting: *Deleted

## 2014-05-08 NOTE — Telephone Encounter (Signed)
Submitted [employer] referral thru [employer] for authorization to Dr. Ernesto Rutherfordobert Groat, MD opthlamologist with authorization number [id number]   DX: 365.11-Prim open angle glaucoma  Number of visits: 6  Start date: 06/11/14 end date: 12/08/14  Paper copy has been faxed to Dr. Dione BoozeGroat for review and records

## 2014-06-11 DIAGNOSIS — H43393 Other vitreous opacities, bilateral: Secondary | ICD-10-CM | POA: Diagnosis not present

## 2014-06-11 DIAGNOSIS — Z961 Presence of intraocular lens: Secondary | ICD-10-CM | POA: Diagnosis not present

## 2014-06-11 DIAGNOSIS — H04123 Dry eye syndrome of bilateral lacrimal glands: Secondary | ICD-10-CM | POA: Diagnosis not present

## 2014-06-11 DIAGNOSIS — H40013 Open angle with borderline findings, low risk, bilateral: Secondary | ICD-10-CM | POA: Diagnosis not present

## 2014-08-29 ENCOUNTER — Other Ambulatory Visit: Payer: Self-pay | Admitting: Family Medicine

## 2014-09-18 ENCOUNTER — Ambulatory Visit (INDEPENDENT_AMBULATORY_CARE_PROVIDER_SITE_OTHER): Payer: Commercial Managed Care - HMO | Admitting: Family Medicine

## 2014-09-18 ENCOUNTER — Encounter: Payer: Self-pay | Admitting: Family Medicine

## 2014-09-18 VITALS — BP 138/70 | HR 68 | Temp 98.6°F | Resp 18 | Wt 123.0 lb

## 2014-09-18 DIAGNOSIS — Z0000 Encounter for general adult medical examination without abnormal findings: Secondary | ICD-10-CM

## 2014-09-18 DIAGNOSIS — E785 Hyperlipidemia, unspecified: Secondary | ICD-10-CM

## 2014-09-18 DIAGNOSIS — Z1211 Encounter for screening for malignant neoplasm of colon: Secondary | ICD-10-CM

## 2014-09-18 DIAGNOSIS — Z23 Encounter for immunization: Secondary | ICD-10-CM

## 2014-09-18 MED ORDER — PIROXICAM 20 MG PO CAPS: ORAL_CAPSULE | ORAL | Status: DC

## 2014-09-18 NOTE — Progress Notes (Signed)
Subjective:    Patient ID: Julia Robinson, female    DOB: April 15, 1940, 74 y.o.   MRN: [id number]  HPI Patient is a 74 year old white female who is here today for complete physical exam. She is overdue for a mammogram as well as a colonoscopy.  Patient refuses colonoscopy as well as mammogram. She is due for Prevnar 13. She has had Pneumovax 23. She is also due for fasting lab work. Patient has a history of a hysterectomy and therefore does not require Pap smears. Her only complaint is persistent low back pain. She takes piroxicam intermittently which seems to control the pain she is requesting a refill on this. I cautioned the patient about peptic ulcer disease. I recommended she use the medication intermittently and she agrees. Past Medical History  Diagnosis Date  . Hyperlipidemia   . Hypertension   . Diverticulosis   . Osteoporosis   . Prolapsed uterus    Past Surgical History  Procedure Laterality Date  . Abdominal hysterectomy      A+P repair 2007   Current Outpatient Prescriptions on File Prior to Visit  Medication Sig Dispense Refill  . aspirin 81 MG tablet Take 81 mg by mouth daily.    Marland Kitchen diltiazem (CARDIZEM) 120 MG tablet TAKE ONE TABLET BY MOUTH ONCE DAILY 90 tablet 4  . doxazosin (CARDURA) 2 MG tablet TAKE 1 TABLET EVERY DAY 90 tablet 3  . furosemide (LASIX) 20 MG tablet Take 20 mg by mouth as needed.    . lovastatin (MEVACOR) 20 MG tablet TAKE 1 TABLET AT BEDTIME 90 tablet 3   No current facility-administered medications on file prior to visit.   Allergies  Allergen Reactions  . Ace Inhibitors     Cough  . Amlodipine     Swelling  . Codeine   . Crestor [Rosuvastatin]   . Dramamine [Dimenhydrinate]   . Lipitor [Atorvastatin]     Constipation  . Quinine Derivatives     Hives  . Sulfa Antibiotics    History   Social History  . Marital Status: Married    Spouse Name: N/A  . Number of Children: N/A  . Years of Education: N/A   Occupational History  . Not  on file.   Social History Main Topics  . Smoking status: Never Smoker   . Smokeless tobacco: Not on file  . Alcohol Use: No  . Drug Use: No  . Sexual Activity: Not on file   Other Topics Concern  . Not on file   Social History Narrative   Family History  Problem Relation Age of Onset  . Heart disease Mother   . Diabetes Sister   . Hypertension Sister   . Hyperlipidemia Sister   . Cancer Maternal Aunt     lung      Review of Systems  All other systems reviewed and are negative.      Objective:   Physical Exam  Constitutional: She is oriented to person, place, and time. She appears well-developed and well-nourished. No distress.  HENT:  Head: Normocephalic and atraumatic.  Right Ear: External ear normal.  Left Ear: External ear normal.  Nose: Nose normal.  Mouth/Throat: Oropharynx is clear and moist. No oropharyngeal exudate.  Eyes: Conjunctivae and EOM are normal. Pupils are equal, round, and reactive to light. Right eye exhibits no discharge. Left eye exhibits no discharge. No scleral icterus.  Neck: Normal range of motion. Neck supple. No JVD present. No tracheal deviation present. No thyromegaly present.  Cardiovascular: Normal rate, regular rhythm, normal heart sounds and intact distal pulses.  Exam reveals no gallop and no friction rub.   No murmur heard. Pulmonary/Chest: Effort normal and breath sounds normal. No stridor. No respiratory distress. She has no wheezes. She has no rales. She exhibits no tenderness.  Abdominal: Soft. Bowel sounds are normal. She exhibits no distension and no mass. There is no tenderness. There is no rebound and no guarding.  Musculoskeletal: Normal range of motion. She exhibits no edema or tenderness.  Lymphadenopathy:    She has no cervical adenopathy.  Neurological: She is alert and oriented to person, place, and time. She has normal reflexes. She displays normal reflexes. No cranial nerve deficit. She exhibits normal muscle tone.  Coordination normal.  Skin: Skin is warm. No rash noted. She is not diaphoretic. No erythema. No pallor.  Psychiatric: She has a normal mood and affect. Her behavior is normal. Judgment and thought content normal.  Vitals reviewed.         Assessment & Plan:  Need for prophylactic vaccination against Streptococcus pneumoniae (pneumococcus) - Plan: Pneumococcal conjugate vaccine 13-valent IM  Routine general medical examination at a health care facility - Plan: CBC with Differential/Platelet  HLD (hyperlipidemia) - Plan: CBC with Differential/Platelet, COMPLETE METABOLIC PANEL WITH GFR, Lipid panel  Patient's physical exam today is normal. Her blood pressure is normal. Patient received Prevnar 13 today in the clinic. I recommended a mammogram. Also recommended a colonoscopy. Patient declines both. She will consent to fecal occult blood cards 3. I've also asked her to return fasting for fasting lab work including a CBC, CMP, fasting lipid panel. Preventative care was discussed.

## 2014-09-22 ENCOUNTER — Other Ambulatory Visit: Payer: Commercial Managed Care - HMO

## 2014-09-22 DIAGNOSIS — E785 Hyperlipidemia, unspecified: Secondary | ICD-10-CM | POA: Diagnosis not present

## 2014-09-22 DIAGNOSIS — Z1211 Encounter for screening for malignant neoplasm of colon: Secondary | ICD-10-CM | POA: Diagnosis not present

## 2014-09-22 DIAGNOSIS — Z0000 Encounter for general adult medical examination without abnormal findings: Secondary | ICD-10-CM | POA: Diagnosis not present

## 2014-09-22 LAB — LIPID PANEL
Cholesterol: 157 mg/dL (ref 0–200)
HDL: 79 mg/dL (ref >=46)
LDL CALC: 63 mg/dL (ref 0–99)
Total CHOL/HDL Ratio: 2 Ratio
Triglycerides: 75 mg/dL (ref <150)
VLDL: 15 mg/dL (ref 0–40)

## 2014-09-22 LAB — COMPLETE METABOLIC PANEL WITH GFR
ALT: 11 U/L (ref 0–35)
AST: 17 U/L (ref 0–37)
Albumin: 4 g/dL (ref 3.5–5.2)
Alkaline Phosphatase: 61 U/L (ref 39–117)
BILIRUBIN TOTAL: 0.7 mg/dL (ref 0.2–1.2)
BUN: 13 mg/dL (ref 6–23)
CO2: 29 meq/L (ref 19–32)
Calcium: 9.4 mg/dL (ref 8.4–10.5)
Chloride: 107 mEq/L (ref 96–112)
Creat: 0.96 mg/dL (ref 0.50–1.10)
GFR, EST AFRICAN AMERICAN: 68 mL/min
GFR, EST NON AFRICAN AMERICAN: 59 mL/min — AB
GLUCOSE: 89 mg/dL (ref 70–99)
POTASSIUM: 3.9 meq/L (ref 3.5–5.3)
SODIUM: 143 meq/L (ref 135–145)
Total Protein: 6.2 g/dL (ref 6.0–8.3)

## 2014-09-22 LAB — CBC WITH DIFFERENTIAL/PLATELET
BASOS PCT: 1 % (ref 0–1)
Basophils Absolute: 0 10*3/uL (ref 0.0–0.1)
Eosinophils Absolute: 0.1 10*3/uL (ref 0.0–0.7)
Eosinophils Relative: 3 % (ref 0–5)
HCT: 34.4 % — ABNORMAL LOW (ref 36.0–46.0)
Hemoglobin: 11.3 g/dL — ABNORMAL LOW (ref 12.0–15.0)
Lymphocytes Relative: 35 % (ref 12–46)
Lymphs Abs: 1 10*3/uL (ref 0.7–4.0)
MCH: 29 pg (ref 26.0–34.0)
MCHC: 32.8 g/dL (ref 30.0–36.0)
MCV: 88.4 fL (ref 78.0–100.0)
MONO ABS: 0.2 10*3/uL (ref 0.1–1.0)
MPV: 9.2 fL (ref 8.6–12.4)
Monocytes Relative: 8 % (ref 3–12)
Neutro Abs: 1.5 10*3/uL — ABNORMAL LOW (ref 1.7–7.7)
Neutrophils Relative %: 53 % (ref 43–77)
PLATELETS: 237 10*3/uL (ref 150–400)
RBC: 3.89 MIL/uL (ref 3.87–5.11)
RDW: 14.2 % (ref 11.5–15.5)
WBC: 2.8 10*3/uL — ABNORMAL LOW (ref 4.0–10.5)

## 2014-09-23 ENCOUNTER — Other Ambulatory Visit: Payer: Self-pay | Admitting: Family Medicine

## 2014-09-23 DIAGNOSIS — R799 Abnormal finding of blood chemistry, unspecified: Secondary | ICD-10-CM

## 2014-09-23 DIAGNOSIS — K921 Melena: Secondary | ICD-10-CM

## 2014-09-23 LAB — FECAL OCCULT BLOOD, IMMUNOCHEMICAL
FECAL OCCULT BLOOD: NEGATIVE
Fecal Occult Blood: NEGATIVE
Fecal Occult Blood: POSITIVE — AB

## 2014-11-27 ENCOUNTER — Encounter: Payer: Self-pay | Admitting: Gastroenterology

## 2014-12-16 ENCOUNTER — Ambulatory Visit (INDEPENDENT_AMBULATORY_CARE_PROVIDER_SITE_OTHER): Payer: Commercial Managed Care - HMO | Admitting: Family Medicine

## 2014-12-16 DIAGNOSIS — Z23 Encounter for immunization: Secondary | ICD-10-CM | POA: Diagnosis not present

## 2014-12-26 ENCOUNTER — Other Ambulatory Visit: Payer: Self-pay | Admitting: Family Medicine

## 2014-12-26 NOTE — Telephone Encounter (Signed)
Medication refilled per protocol. 

## 2015-04-30 ENCOUNTER — Other Ambulatory Visit: Payer: Self-pay | Admitting: Family Medicine

## 2015-04-30 NOTE — Telephone Encounter (Signed)
Medication refilled per protocol. 

## 2015-05-26 ENCOUNTER — Ambulatory Visit (INDEPENDENT_AMBULATORY_CARE_PROVIDER_SITE_OTHER): Payer: Commercial Managed Care - HMO | Admitting: Family Medicine

## 2015-05-26 ENCOUNTER — Encounter: Payer: Self-pay | Admitting: Family Medicine

## 2015-05-26 VITALS — BP 130/74 | HR 74 | Temp 98.3°F | Resp 16 | Ht <= 58 in | Wt 131.0 lb

## 2015-05-26 DIAGNOSIS — I10 Essential (primary) hypertension: Secondary | ICD-10-CM | POA: Diagnosis not present

## 2015-05-26 DIAGNOSIS — E785 Hyperlipidemia, unspecified: Secondary | ICD-10-CM | POA: Diagnosis not present

## 2015-05-26 LAB — COMPLETE METABOLIC PANEL WITH GFR
ALBUMIN: 4.5 g/dL (ref 3.6–5.1)
ALK PHOS: 69 U/L (ref 33–130)
ALT: 15 U/L (ref 6–29)
AST: 20 U/L (ref 10–35)
BUN: 24 mg/dL (ref 7–25)
CALCIUM: 9.8 mg/dL (ref 8.6–10.4)
CO2: 22 mmol/L (ref 20–31)
CREATININE: 0.91 mg/dL (ref 0.60–0.93)
Chloride: 103 mmol/L (ref 98–110)
GFR, Est African American: 73 mL/min (ref >=60)
GFR, Est Non African American: 63 mL/min (ref >=60)
GLUCOSE: 92 mg/dL (ref 70–99)
Potassium: 4 mmol/L (ref 3.5–5.3)
Sodium: 138 mmol/L (ref 135–146)
TOTAL PROTEIN: 7 g/dL (ref 6.1–8.1)
Total Bilirubin: 0.6 mg/dL (ref 0.2–1.2)

## 2015-05-26 LAB — CBC WITH DIFFERENTIAL/PLATELET
BASOS ABS: 0 10*3/uL (ref 0.0–0.1)
Basophils Relative: 1 % (ref 0–1)
EOS ABS: 0.1 10*3/uL (ref 0.0–0.7)
Eosinophils Relative: 3 % (ref 0–5)
HCT: 36.5 % (ref 36.0–46.0)
Hemoglobin: 11.6 g/dL — ABNORMAL LOW (ref 12.0–15.0)
LYMPHS ABS: 1.4 10*3/uL (ref 0.7–4.0)
LYMPHS PCT: 31 % (ref 12–46)
MCH: 28.5 pg (ref 26.0–34.0)
MCHC: 31.8 g/dL (ref 30.0–36.0)
MCV: 89.7 fL (ref 78.0–100.0)
MPV: 9.3 fL (ref 8.6–12.4)
Monocytes Absolute: 0.4 10*3/uL (ref 0.1–1.0)
Monocytes Relative: 8 % (ref 3–12)
NEUTROS PCT: 57 % (ref 43–77)
Neutro Abs: 2.6 10*3/uL (ref 1.7–7.7)
PLATELETS: 257 10*3/uL (ref 150–400)
RBC: 4.07 MIL/uL (ref 3.87–5.11)
RDW: 14.6 % (ref 11.5–15.5)
WBC: 4.5 10*3/uL (ref 4.0–10.5)

## 2015-05-26 LAB — LIPID PANEL
CHOLESTEROL: 163 mg/dL (ref 125–200)
HDL: 75 mg/dL (ref >=46)
LDL Cholesterol: 75 mg/dL (ref <130)
Total CHOL/HDL Ratio: 2.2 Ratio (ref <=5.0)
Triglycerides: 67 mg/dL (ref <150)
VLDL: 13 mg/dL (ref <30)

## 2015-05-26 NOTE — Progress Notes (Signed)
Subjective:    Patient ID: Julia Robinson, female    DOB: 05-14-1940, 75 y.o.   MRN: [id number]  HPI 6/16 Patient is a 75 year old white female who is here today for complete physical exam. She is overdue for a mammogram as well as a colonoscopy.  Patient refuses colonoscopy as well as mammogram. She is due for Prevnar 13. She has had Pneumovax 23. She is also due for fasting lab work. Patient has a history of a hysterectomy and therefore does not require Pap smears. Her only complaint is persistent low back pain. She takes piroxicam intermittently which seems to control the pain she is requesting a refill on this. I cautioned the patient about peptic ulcer disease. I recommended she use the medication intermittently and she agrees.  At that time, my plan was: Patient's physical exam today is normal. Her blood pressure is normal. Patient received Prevnar 13 today in the clinic. I recommended a mammogram. Also recommended a colonoscopy. Patient declines both. She will consent to fecal occult blood cards 3. I've also asked her to return fasting for fasting lab work including a CBC, CMP, fasting lipid panel. Preventative care was discussed.  05/26/15 At that time, labs were nml except for mild anemia and 1/3 stool cards positive for blood.  I recommended GI consult.  She attributed the blood in her stool to milkshakes that she was drinking. She has not seen GI. She is here today for follow-up. She denies any chest pain shortness breath or dyspnea on exertion. She is due today to recheck her blood pressure along with her cholesterol.  Past Medical History  Diagnosis Date  . Hyperlipidemia   . Hypertension   . Diverticulosis   . Osteoporosis   . Prolapsed uterus    Past Surgical History  Procedure Laterality Date  . Abdominal hysterectomy      A+P repair 2007   Current Outpatient Prescriptions on File Prior to Visit  Medication Sig Dispense Refill  . aspirin 81 MG tablet Take 81 mg by mouth  daily.    Marland Kitchen diltiazem (CARDIZEM) 120 MG tablet TAKE ONE TABLET BY MOUTH ONCE DAILY 90 tablet 4  . doxazosin (CARDURA) 2 MG tablet TAKE 1 TABLET EVERY DAY 90 tablet 0  . furosemide (LASIX) 20 MG tablet Take 20 mg by mouth as needed.    . lovastatin (MEVACOR) 20 MG tablet TAKE 1 TABLET AT BEDTIME 90 tablet 0  . piroxicam (FELDENE) 20 MG capsule TAKE ONE CAPSULE BY MOUTH ONCE DAILY AS NEEDED FOR PAIN 30 capsule 4   No current facility-administered medications on file prior to visit.   Allergies  Allergen Reactions  . Ace Inhibitors     Cough  . Amlodipine     Swelling  . Codeine   . Crestor [Rosuvastatin]   . Dramamine [Dimenhydrinate]   . Lipitor [Atorvastatin]     Constipation  . Quinine Derivatives     Hives  . Sulfa Antibiotics    Social History   Social History  . Marital Status: Married    Spouse Name: N/A  . Number of Children: N/A  . Years of Education: N/A   Occupational History  . Not on file.   Social History Main Topics  . Smoking status: Never Smoker   . Smokeless tobacco: Not on file  . Alcohol Use: No  . Drug Use: No  . Sexual Activity: Not on file   Other Topics Concern  . Not on file   Social History  Narrative   Family History  Problem Relation Age of Onset  . Heart disease Mother   . Diabetes Sister   . Hypertension Sister   . Hyperlipidemia Sister   . Cancer Maternal Aunt     lung      Review of Systems  All other systems reviewed and are negative.      Objective:   Physical Exam  Constitutional: She is oriented to person, place, and time. She appears well-developed and well-nourished. No distress.  HENT:  Head: Normocephalic and atraumatic.  Right Ear: External ear normal.  Left Ear: External ear normal.  Nose: Nose normal.  Mouth/Throat: Oropharynx is clear and moist. No oropharyngeal exudate.  Eyes: Conjunctivae and EOM are normal. Pupils are equal, round, and reactive to light. Right eye exhibits no discharge. Left eye  exhibits no discharge. No scleral icterus.  Neck: Normal range of motion. Neck supple. No JVD present. No tracheal deviation present. No thyromegaly present.  Cardiovascular: Normal rate, regular rhythm, normal heart sounds and intact distal pulses.  Exam reveals no gallop and no friction rub.   No murmur heard. Pulmonary/Chest: Effort normal and breath sounds normal. No stridor. No respiratory distress. She has no wheezes. She has no rales. She exhibits no tenderness.  Abdominal: Soft. Bowel sounds are normal. She exhibits no distension and no mass. There is no tenderness. There is no rebound and no guarding.  Musculoskeletal: Normal range of motion. She exhibits no edema or tenderness.  Lymphadenopathy:    She has no cervical adenopathy.  Neurological: She is alert and oriented to person, place, and time. She has normal reflexes. No cranial nerve deficit. She exhibits normal muscle tone. Coordination normal.  Skin: Skin is warm. No rash noted. She is not diaphoretic. No erythema. No pallor.  Psychiatric: She has a normal mood and affect. Her behavior is normal. Judgment and thought content normal.  Vitals reviewed.         Assessment & Plan:  HLD (hyperlipidemia) - Plan: COMPLETE METABOLIC PANEL WITH GFR, Lipid panel  Benign essential HTN - Plan: CBC with Differential/Platelet  Her blood pressure is excellent. I will make no changes in her blood pressure medication at this time. I will check a fasting lipid panel. Her goal LDL cholesterol is less than 130. I will recheck a CBC to monitor her white blood cell count and her hemoglobin. If there has been a further drop in her hemoglobin, I would push the GI consultation for active blood loss. If her hemoglobin is stable I will recommend doing cologuard as she is adamant that she does not want to proceed with colonoscopy at this time

## 2015-06-10 ENCOUNTER — Other Ambulatory Visit: Payer: Self-pay | Admitting: Family Medicine

## 2015-06-10 MED ORDER — DOXAZOSIN MESYLATE 2 MG PO TABS: 2.0000 mg | ORAL_TABLET | Freq: Every day | ORAL | Status: DC

## 2015-06-10 MED ORDER — LOVASTATIN 20 MG PO TABS
20.0000 mg | ORAL_TABLET | Freq: Every day | ORAL | Status: DC
Start: 2015-06-10 — End: 2016-05-09

## 2015-06-18 DIAGNOSIS — T180XXA Foreign body in mouth, initial encounter: Secondary | ICD-10-CM | POA: Diagnosis not present

## 2015-06-19 ENCOUNTER — Telehealth: Payer: Self-pay | Admitting: *Deleted

## 2015-06-19 NOTE — Telephone Encounter (Signed)
Submitted human[facility] referral thru [facility] for authorization on 06/15/15 to Dr Terisa Starrobert Groat,MD with authorization [id number]  Requesting provider: Jaquelyn BitterWarren Pickard,MD  Treating provider: Terisa Starrobert Groat,MD  Number of visits:6  Start Date: 06/26/15  End Date:12/23/15  Dx: H52.4- Presbyopia

## 2015-09-10 ENCOUNTER — Encounter (HOSPITAL_COMMUNITY)
Admission: EM | Disposition: A | Discharge status: Home / Self Care | Payer: Self-pay | Source: Home / Self Care | Attending: Emergency Medicine

## 2015-09-10 ENCOUNTER — Encounter (HOSPITAL_COMMUNITY): Payer: Self-pay

## 2015-09-10 ENCOUNTER — Ambulatory Visit (HOSPITAL_COMMUNITY)
Admission: EM | Admit: 2015-09-10 | Discharge: 2015-09-11 | Disposition: A | Discharge status: Home / Self Care | Payer: Commercial Managed Care - HMO | Attending: Emergency Medicine | Admitting: Emergency Medicine

## 2015-09-10 ENCOUNTER — Emergency Department (HOSPITAL_COMMUNITY): Payer: Commercial Managed Care - HMO

## 2015-09-10 DIAGNOSIS — S62522B Displaced fracture of distal phalanx of left thumb, initial encounter for open fracture: Secondary | ICD-10-CM | POA: Diagnosis not present

## 2015-09-10 DIAGNOSIS — S68522A Partial traumatic transphalangeal amputation of left thumb, initial encounter: Secondary | ICD-10-CM | POA: Insufficient documentation

## 2015-09-10 DIAGNOSIS — Z23 Encounter for immunization: Secondary | ICD-10-CM | POA: Diagnosis not present

## 2015-09-10 DIAGNOSIS — E7800 Pure hypercholesterolemia, unspecified: Secondary | ICD-10-CM | POA: Insufficient documentation

## 2015-09-10 DIAGNOSIS — E785 Hyperlipidemia, unspecified: Secondary | ICD-10-CM | POA: Diagnosis not present

## 2015-09-10 DIAGNOSIS — W230XXA Caught, crushed, jammed, or pinched between moving objects, initial encounter: Secondary | ICD-10-CM | POA: Insufficient documentation

## 2015-09-10 DIAGNOSIS — Z79899 Other long term (current) drug therapy: Secondary | ICD-10-CM | POA: Diagnosis not present

## 2015-09-10 DIAGNOSIS — S6702XA Crushing injury of left thumb, initial encounter: Secondary | ICD-10-CM | POA: Insufficient documentation

## 2015-09-10 DIAGNOSIS — S68022A Partial traumatic metacarpophalangeal amputation of left thumb, initial encounter: Secondary | ICD-10-CM | POA: Diagnosis not present

## 2015-09-10 DIAGNOSIS — I10 Essential (primary) hypertension: Secondary | ICD-10-CM | POA: Diagnosis not present

## 2015-09-10 DIAGNOSIS — Z7982 Long term (current) use of aspirin: Secondary | ICD-10-CM | POA: Insufficient documentation

## 2015-09-10 DIAGNOSIS — S61112A Laceration without foreign body of left thumb with damage to nail, initial encounter: Secondary | ICD-10-CM | POA: Diagnosis not present

## 2015-09-10 HISTORY — DX: Pure hypercholesterolemia, unspecified: E78.00

## 2015-09-10 HISTORY — PX: I&D EXTREMITY: SHX5045

## 2015-09-10 HISTORY — PX: CLOSED REDUCTION FINGER WITH PERCUTANEOUS PINNING: SHX5612

## 2015-09-10 LAB — BASIC METABOLIC PANEL
Anion gap: 7 (ref 5–15)
BUN: 30 mg/dL — ABNORMAL HIGH (ref 6–20)
CALCIUM: 9.4 mg/dL (ref 8.9–10.3)
CO2: 24 mmol/L (ref 22–32)
Chloride: 106 mmol/L (ref 101–111)
Creatinine, Ser: 0.99 mg/dL (ref 0.44–1.00)
GFR calc Af Amer: >60 mL/min (ref >60)
GFR calc non Af Amer: 55 mL/min — ABNORMAL LOW (ref >60)
GLUCOSE: 95 mg/dL (ref 65–99)
POTASSIUM: 3.8 mmol/L (ref 3.5–5.1)
SODIUM: 137 mmol/L (ref 135–145)

## 2015-09-10 LAB — CBC WITH DIFFERENTIAL/PLATELET
BASOS PCT: 1 %
Basophils Absolute: 0 10*3/uL (ref 0.0–0.1)
EOS ABS: 0.2 10*3/uL (ref 0.0–0.7)
EOS PCT: 3 %
HCT: 32.9 % — ABNORMAL LOW (ref 36.0–46.0)
Hemoglobin: 11.1 g/dL — ABNORMAL LOW (ref 12.0–15.0)
LYMPHS ABS: 1.8 10*3/uL (ref 0.7–4.0)
Lymphocytes Relative: 34 %
MCH: 29.5 pg (ref 26.0–34.0)
MCHC: 33.7 g/dL (ref 30.0–36.0)
MCV: 87.5 fL (ref 78.0–100.0)
Monocytes Absolute: 0.3 10*3/uL (ref 0.1–1.0)
Monocytes Relative: 5 %
Neutro Abs: 3.1 10*3/uL (ref 1.7–7.7)
Neutrophils Relative %: 57 %
PLATELETS: 246 10*3/uL (ref 150–400)
RBC: 3.76 MIL/uL — AB (ref 3.87–5.11)
RDW: 14.2 % (ref 11.5–15.5)
WBC: 5.4 10*3/uL (ref 4.0–10.5)

## 2015-09-10 SURGERY — CLOSED REDUCTION, FINGER, WITH PERCUTANEOUS PINNING
Anesthesia: General | Site: Thumb | Laterality: Left

## 2015-09-10 MED ORDER — BUPIVACAINE HCL (PF) 0.25 % IJ SOLN
20.0000 mL | Freq: Once | INTRAMUSCULAR | Status: AC
  Administered 2015-09-10: 20 mL
  Filled 2015-09-10: qty 30

## 2015-09-10 MED ORDER — FENTANYL CITRATE (PF) 250 MCG/5ML IJ SOLN
INTRAMUSCULAR | Status: AC
  Filled 2015-09-10: qty 5

## 2015-09-10 MED ORDER — TETANUS-DIPHTH-ACELL PERTUSSIS 5-2.5-18.5 LF-MCG/0.5 IM SUSP
0.5000 mL | Freq: Once | INTRAMUSCULAR | Status: AC
  Administered 2015-09-10: 0.5 mL via INTRAMUSCULAR
  Filled 2015-09-10: qty 0.5

## 2015-09-10 MED ORDER — 0.9 % SODIUM CHLORIDE (POUR BTL) OPTIME
TOPICAL | Status: DC | PRN
  Administered 2015-09-11: 1000 mL

## 2015-09-10 MED ORDER — BUPIVACAINE HCL (PF) 0.25 % IJ SOLN
INTRAMUSCULAR | Status: AC
  Filled 2015-09-10: qty 30

## 2015-09-10 MED ORDER — PROPOFOL 10 MG/ML IV BOLUS
INTRAVENOUS | Status: AC
  Filled 2015-09-10: qty 20

## 2015-09-10 MED ORDER — LIDOCAINE HCL (PF) 1 % IJ SOLN
5.0000 mL | Freq: Once | INTRAMUSCULAR | Status: AC
  Administered 2015-09-10: 5 mL
  Filled 2015-09-10: qty 5

## 2015-09-10 MED ORDER — CEFAZOLIN SODIUM 1-5 GM-% IV SOLN: 1.0000 g | Freq: Once | INTRAVENOUS | Status: DC

## 2015-09-10 SURGICAL SUPPLY — 53 items
APL SKNCLS STERI-STRIP NONHPOA (GAUZE/BANDAGES/DRESSINGS)
BANDAGE COBAN STERILE 2 (GAUZE/BANDAGES/DRESSINGS) IMPLANT
BANDAGE ELASTIC 3 VELCRO ST LF (GAUZE/BANDAGES/DRESSINGS) ×1 IMPLANT
BANDAGE ELASTIC 4 VELCRO ST LF (GAUZE/BANDAGES/DRESSINGS) ×1 IMPLANT
BENZOIN TINCTURE PRP APPL 2/3 (GAUZE/BANDAGES/DRESSINGS) ×1 IMPLANT
BLADE SURG ROTATE 9660 (MISCELLANEOUS) ×1 IMPLANT
BNDG CMPR 9X4 STRL LF SNTH (GAUZE/BANDAGES/DRESSINGS)
BNDG COHESIVE 1X5 TAN STRL LF (GAUZE/BANDAGES/DRESSINGS) ×2 IMPLANT
BNDG ESMARK 4X9 LF (GAUZE/BANDAGES/DRESSINGS) ×1 IMPLANT
BNDG GAUZE ELAST 4 BULKY (GAUZE/BANDAGES/DRESSINGS) ×3 IMPLANT
CANISTER SUCTION 2500CC (MISCELLANEOUS) ×2 IMPLANT
CHLORAPREP W/TINT 26ML (MISCELLANEOUS) ×1 IMPLANT
CLOSURE WOUND 1/2 X4 (GAUZE/BANDAGES/DRESSINGS)
CORDS BIPOLAR (ELECTRODE) ×3 IMPLANT
COVER SURGICAL LIGHT HANDLE (MISCELLANEOUS) ×3 IMPLANT
CUFF TOURNIQUET SINGLE 18IN (TOURNIQUET CUFF) ×2 IMPLANT
CUFF TOURNIQUET SINGLE 24IN (TOURNIQUET CUFF) IMPLANT
DRAPE C-ARM MINI 42X72 WSTRAPS (DRAPES) ×3 IMPLANT
DRAPE OEC MINIVIEW 54X84 (DRAPES) ×3 IMPLANT
DRAPE SURG 17X23 STRL (DRAPES) ×3 IMPLANT
DRSG EMULSION OIL 3X3 NADH (GAUZE/BANDAGES/DRESSINGS) ×3 IMPLANT
GAUZE SPONGE 4X4 12PLY STRL (GAUZE/BANDAGES/DRESSINGS) ×3 IMPLANT
GAUZE XEROFORM 1X8 LF (GAUZE/BANDAGES/DRESSINGS) ×3 IMPLANT
GLOVE BIO SURGEON STRL SZ7.5 (GLOVE) ×3 IMPLANT
GLOVE BIOGEL PI IND STRL 8 (GLOVE) ×1 IMPLANT
GLOVE BIOGEL PI INDICATOR 8 (GLOVE) ×2
GOWN STRL REUS W/ TWL LRG LVL3 (GOWN DISPOSABLE) ×3 IMPLANT
GOWN STRL REUS W/TWL LRG LVL3 (GOWN DISPOSABLE) ×9
K-Wire (Wire) ×2 IMPLANT
KIT BASIN OR (CUSTOM PROCEDURE TRAY) ×3 IMPLANT
KIT ROOM TURNOVER OR (KITS) ×3 IMPLANT
MANIFOLD NEPTUNE II (INSTRUMENTS) ×1 IMPLANT
NDL HYPO 25GX1X1/2 BEV (NEEDLE) ×1 IMPLANT
NEEDLE HYPO 25GX1X1/2 BEV (NEEDLE) ×3 IMPLANT
NS IRRIG 1000ML POUR BTL (IV SOLUTION) ×3 IMPLANT
PACK ORTHO EXTREMITY (CUSTOM PROCEDURE TRAY) ×3 IMPLANT
PAD ARMBOARD 7.5X6 YLW CONV (MISCELLANEOUS) ×6 IMPLANT
SPLINT FINGER W/BULB (SOFTGOODS) ×2 IMPLANT
SPONGE GAUZE 4X4 12PLY STER LF (GAUZE/BANDAGES/DRESSINGS) ×2 IMPLANT
STRIP CLOSURE SKIN 1/2X4 (GAUZE/BANDAGES/DRESSINGS) ×1 IMPLANT
SUCTION FRAZIER HANDLE 10FR (MISCELLANEOUS) ×2
SUCTION TUBE FRAZIER 10FR DISP (MISCELLANEOUS) ×1 IMPLANT
SUT CHROMIC 6 0 PS 4 (SUTURE) ×2 IMPLANT
SUT ETHILON 4 0 P 3 18 (SUTURE) IMPLANT
SUT MON AB 5-0 PS2 18 (SUTURE) ×2 IMPLANT
SUT PROLENE 4 0 P 3 18 (SUTURE) IMPLANT
SYR CONTROL 10ML LL (SYRINGE) ×3 IMPLANT
TOWEL OR 17X24 6PK STRL BLUE (TOWEL DISPOSABLE) ×3 IMPLANT
TOWEL OR 17X26 10 PK STRL BLUE (TOWEL DISPOSABLE) ×3 IMPLANT
TUBE CONNECTING 12'X1/4 (SUCTIONS) ×1
TUBE CONNECTING 12X1/4 (SUCTIONS) ×2 IMPLANT
TUBE FEEDING 5FR 15 INCH (TUBING) IMPLANT
WATER STERILE IRR 1000ML POUR (IV SOLUTION) ×1 IMPLANT

## 2015-09-10 NOTE — ED Notes (Signed)
Patient finger wrapped and pressure being held by patient. Verbal orders given by H. Neese-NP

## 2015-09-10 NOTE — Anesthesia Preprocedure Evaluation (Addendum)
Anesthesia Evaluation  Patient identified by MRN, date of birth, ID band Patient awake    Reviewed: Allergy & Precautions, NPO status , Patient's Chart, lab work & pertinent test results  History of Anesthesia Complications Negative for: history of anesthetic complications  Airway Mallampati: II  TM Distance: >3 FB Neck ROM: Full    Dental  (+) Teeth Intact   Pulmonary neg pulmonary ROS,    breath sounds clear to auscultation       Cardiovascular hypertension,  Rhythm:Regular Rate:Normal     Neuro/Psych    GI/Hepatic negative GI ROS, Neg liver ROS,   Endo/Other  negative endocrine ROS  Renal/GU negative Renal ROS     Musculoskeletal negative musculoskeletal ROS (+)   Abdominal   Peds  Hematology negative hematology ROS (+)   Anesthesia Other Findings   Reproductive/Obstetrics                            Anesthesia Physical Anesthesia Plan  ASA: II and emergent  Anesthesia Plan: General   Post-op Pain Management:    Induction: Intravenous  Airway Management Planned: LMA  Additional Equipment:   Intra-op Plan:   Post-operative Plan: Extubation in OR  Informed Consent: I have reviewed the patients History and Physical, chart, labs and discussed the procedure including the risks, benefits and alternatives for the proposed anesthesia with the patient or authorized representative who has indicated his/her understanding and acceptance.   Dental advisory given  Plan Discussed with: CRNA and Surgeon  Anesthesia Plan Comments:         Anesthesia Quick Evaluation

## 2015-09-10 NOTE — ED Provider Notes (Signed)
CSN: [id number]     Arrival date & time 09/10/15  1743 History   First MD Initiated Contact with Patient 09/10/15 1752     Chief Complaint  Patient presents with  . Finger Injury     (Consider location/radiation/quality/duration/timing/severity/associated sxs/prior Treatment) The history is provided by the patient, medical records and a significant other. No language interpreter was used.     Julia Robinson is a 75 y.o. female  with a hx of HTN, hyperlipidemia, osetoporosis presents to the Emergency Department complaining of acute, persistent left thumb injury onset approx 7pm tonight. 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 She had her finger on the back of the truck and the plow fell down on her thumb.  Pt was seen at [facility] earlier today.  Pt is to see Dr. Merlyn Lot for repair.    Past Medical History  Diagnosis Date  . Hyperlipidemia   . Hypertension   . Diverticulosis   . Osteoporosis   . Prolapsed uterus   . High cholesterol    Past Surgical History  Procedure Laterality Date  . Abdominal hysterectomy      A+P repair 2007  . Back surgery     Family History  Problem Relation Age of Onset  . Heart disease Mother   . Diabetes Sister   . Hypertension Sister   . Hyperlipidemia Sister   . Cancer Maternal Aunt     lung   Social History  Substance Use Topics  . Smoking status: Never Smoker   . Smokeless tobacco: None  . Alcohol Use: No   OB History    No data available     Review of Systems  Constitutional: Negative for fever.  Gastrointestinal: Negative for nausea and vomiting.  Skin: Positive for wound.  Allergic/Immunologic: Negative for immunocompromised state.  Neurological: Negative for weakness and numbness.  Hematological: Does not bruise/bleed easily.  Psychiatric/Behavioral: The patient is nervous/anxious.       Allergies  Ace inhibitors; Amlodipine; Codeine; Crestor;  Dramamine; Lipitor; Quinine derivatives; and Sulfa antibiotics  Home Medications   Prior to Admission medications   Medication Sig Start Date End Date Taking? Authorizing Provider  aspirin 81 MG tablet Take 81 mg by mouth daily.    Historical Provider, MD  doxazosin (CARDURA) 2 MG tablet Take 1 tablet (2 mg total) by mouth daily. 06/10/15   [name], MD  furosemide (LASIX) 20 MG tablet Take 20 mg by mouth as needed.    Historical Provider, MD  lovastatin (MEVACOR) 20 MG tablet Take 1 tablet (20 mg total) by mouth at bedtime. 06/10/15   [name], MD  Magnesium 400 MG CAPS Take by mouth.    Historical Provider, MD  piroxicam (FELDENE) 20 MG capsule TAKE ONE CAPSULE BY MOUTH ONCE DAILY AS NEEDED FOR PAIN 06/10/15   [name], MD  Potassium Gluconate 595 MG CAPS  04/13/15   Historical Provider, MD   BP 189/79 mmHg  Pulse 74  Temp(Src) 97.3 F (36.3 C) (Oral)  Resp 18  Ht  (1.651 m)  Wt 58.968 kg  BMI 21.63 kg/m2  SpO2 100% Physical Exam  Constitutional: She is oriented to person, place, and time. She appears well-developed and well-nourished. No distress.  HENT:  Head: Normocephalic and atraumatic.  Eyes: Conjunctivae are normal. No scleral icterus.  Neck: Normal range of motion.  Cardiovascular: Normal rate, regular rhythm, normal heart sounds  and intact distal pulses.   No murmur heard. Capillary refill < 3 sec  Pulmonary/Chest: Effort normal and breath sounds normal. No respiratory distress.  Musculoskeletal: She exhibits no edema.  Partial amputation and crush injury to the left thumb (see photo in previous chart)  Neurological: She is alert and oriented to person, place, and time.  Decreased strength in the left thumb Sensation absent in distal thumb (pt reports digital block at AP, but reports no sensation in thumb tip after injury but prior to digital block)  Skin: Skin is warm and dry. She is not diaphoretic.  Psychiatric: Her mood appears anxious.   Nursing note and vitals reviewed.   ED Course  Procedures (including critical care time) Labs Review Labs Reviewed  CBC WITH DIFFERENTIAL/PLATELET - Abnormal; Notable for the following:    RBC 3.76 (*)    Hemoglobin 11.1 (*)    HCT 32.9 (*)    All other components within normal limits  BASIC METABOLIC PANEL - Abnormal; Notable for the following:    BUN 30 (*)    GFR calc non Af Amer 55 (*)    All other components within normal limits    Imaging Review Dg Finger Thumb Left  09/10/2015  CLINICAL DATA:  Crush injury and distal following EXAM: LEFT THUMB 2+V COMPARISON:  None. FINDINGS: There is a transverse fracture through the first distal phalanx with associated soft tissue deformity. No other focal abnormality is seen. IMPRESSION: Traumatic amputation of the distal aspect of the first distal phalanx. Electronically Signed   By: Alcide CleverMark  Lukens M.D.   On: 09/10/2015 18:41   I have personally reviewed and evaluated these images and lab results as part of my medical decision-making.   MDM   Final diagnoses:  Partial traumatic metacarpophalangeal amputation of thumb, left, initial encounter    Julia Robinson presents with partial amputation of the left thumb.  Ancef started.  Bleeding controlled at this time. Patient and family visibly anxious. I have discussed the procedure and plan for repair and answered all questions.   9:39 PM Patient has been evaluated by Dr. Merlyn LotKuzma with plan for OR tonight.    The patient was discussed with and seen by Dr. Adela LankFloyd who agrees with the treatment plan.  BP 164/66 mmHg  Pulse 63  Temp(Src) 97.3 F (36.3 C) (Oral)  Resp 18  Ht 5\' 5"  (1.651 m)  Wt 58.968 kg  BMI 21.63 kg/m2  SpO2 99%   Dierdre ForthHannah Konor Noren, PA-C 09/10/15 2142  Melene Planan Floyd, DO 09/10/15 2144

## 2015-09-10 NOTE — ED Notes (Signed)
[facility] surveyorhand surgeon.  Due to crush injury to left thumb

## 2015-09-10 NOTE — ED Notes (Signed)
Repaged out to Hydrographic surveyorHand surgeon through answering service. Waiting for on call MD to call back

## 2015-09-10 NOTE — ED Notes (Signed)
Patients finger soaking in betadine and NS.

## 2015-09-10 NOTE — ED Notes (Signed)
REports of left thumb getting mashed in farm equipment about 30 minutes PTA. Patient has left thumb noted to be attached by skin only.Bleeding controled.

## 2015-09-10 NOTE — ED Provider Notes (Signed)
CSN: [id number]     Arrival date & time 09/10/15  1743 History   First MD Initiated Contact with Patient 09/10/15 1752     Chief Complaint  Patient presents with  . Finger Injury     (Consider location/radiation/quality/duration/timing/severity/associated sxs/prior Treatment) Patient is a 75 y.o. female presenting with skin laceration. The history is provided by the patient.  Laceration Location:  Hand Hand laceration location:  L finger Depth:  Through muscle Quality: jagged   Bleeding: controlled with pressure   Time since incident: just prior to arrival. Injury mechanism: farm equipment. Pain details:    Quality:  Throbbing   Severity:  Mild   Timing:  Constant   Progression:  Worsening Foreign body present:  Unable to specify Relieved by:  Nothing Worsened by:  Nothing tried Tetanus status:  Unknown  Julia Robinson is a 75 y.o. female who presents to the ED with a partial amputation of the left thumb. 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 She had her finger on the back of the truck and the plow fell down on her thumb. Patient complains of pain to part of her thumb and numbness at the distal portion.  Past Medical History  Diagnosis Date  . Hyperlipidemia   . Hypertension   . Diverticulosis   . Osteoporosis   . Prolapsed uterus   . High cholesterol    Past Surgical History  Procedure Laterality Date  . Abdominal hysterectomy      A+P repair 2007  . Back surgery     Family History  Problem Relation Age of Onset  . Heart disease Mother   . Diabetes Sister   . Hypertension Sister   . Hyperlipidemia Sister   . Cancer Maternal Aunt     lung   Social History  Substance Use Topics  . Smoking status: Never Smoker   . Smokeless tobacco: None  . Alcohol Use: No   OB History    No data available     Review of Systems  Skin: Positive for wound.  all other systems  negative    Allergies  Ace inhibitors; Amlodipine; Codeine; Crestor; Dramamine; Lipitor; Quinine derivatives; and Sulfa antibiotics  Home Medications   Prior to Admission medications   Medication Sig Start Date End Date Taking? Authorizing Provider  aspirin 81 MG tablet Take 81 mg by mouth daily.    Historical Provider, MD  doxazosin (CARDURA) 2 MG tablet Take 1 tablet (2 mg total) by mouth daily. 06/10/15   Donita BrooksWarren T Pickard, MD  furosemide (LASIX) 20 MG tablet Take 20 mg by mouth as needed.    Historical Provider, MD  lovastatin (MEVACOR) 20 MG tablet Take 1 tablet (20 mg total) by mouth at bedtime. 06/10/15   Donita BrooksWarren T Pickard, MD  Magnesium 400 MG CAPS Take by mouth.    Historical Provider, MD  piroxicam (FELDENE) 20 MG capsule TAKE ONE CAPSULE BY MOUTH ONCE DAILY AS NEEDED FOR PAIN 06/10/15   Donita BrooksWarren T Pickard, MD  Potassium Gluconate 595 MG CAPS  04/13/15   Historical Provider, MD   BP 182/70 mmHg  Pulse 70  Temp(Src) 97.8 F (36.6 C) (Oral)  Resp 16  Ht 5\' 5"  (1.651 m)  Wt 58.968 kg  BMI 21.63 kg/m2  SpO2 99% Physical Exam  Constitutional: She is oriented to person, place, and time. She appears well-developed and well-nourished. No distress.  HENT:  Head: Normocephalic.  Eyes: EOM are normal.  Neck: Neck supple.  Cardiovascular: Normal rate.   Pulmonary/Chest: Effort normal.  Musculoskeletal:       Left hand: She exhibits decreased range of motion, tenderness, bony tenderness, deformity and laceration. Decreased sensation noted. Decreased strength noted.  Partial amputation and crush injury to the left thumb.   Neurological: She is alert and oriented to person, place, and time. No cranial nerve deficit.  Skin: Skin is warm and dry.  Psychiatric: She has a normal mood and affect. Her behavior is normal.  Nursing note and vitals reviewed.     ED Course  Procedures (including critical care time) LABS,  X-ray, saline lock, tetanus update Wound soaked  Digital  Block Lidocaine 1% and Sensorcaine 0.25% without epinephrine for a total of 4 cc to left thumb Patient tolerated the procedure without any immediate complications.  Labs Review Labs Reviewed  CBC WITH DIFFERENTIAL/PLATELET - Abnormal; Notable for the following:    RBC 3.76 (*)    Hemoglobin 11.1 (*)    HCT 32.9 (*)    All other components within normal limits  BASIC METABOLIC PANEL    Imaging Review Dg Finger Thumb Left  09/10/2015  CLINICAL DATA:  Crush injury and distal following EXAM: LEFT THUMB 2+V COMPARISON:  None. FINDINGS: There is a transverse fracture through the first distal phalanx with associated soft tissue deformity. No other focal abnormality is seen. IMPRESSION: Traumatic amputation of the distal aspect of the first distal phalanx. Electronically Signed   By: Alcide Clever M.D.   On: 09/10/2015 18:41   I have personally reviewed and evaluated these images and lab results as part of my medical decision-making.  I spoke with the PA, Samantha in FT at [facility] and she will notify Dr. Merlyn Lot of patient on arrival.   MDM  75 y.o. female with partial traumatic amputation of the left thumb stable for transfer by private car to [facility] ED for further evaluation of injury. The PA in FT will start IV antibiotics on arrival.    Final diagnoses:  Partial traumatic metacarpophalangeal amputation of thumb, left, initial encounter      Janne Napoleon, NP 09/10/15 1949  Samuel Jester, DO 09/12/15 [phone]

## 2015-09-10 NOTE — ED Notes (Addendum)
Patient instructed to stay NPO. Verbalizes understanding. Patient may go pov per H. Neese-NP.

## 2015-09-10 NOTE — H&P (Signed)
Julia Robinson is an 75 y.o. female.   Chief Complaint: left thumb crush injury HPI: 75 yo rhd female present with husband and son state she smashed left thumb while helping load a plow on a truck.  Caused laceration and bleeding of left thumb.  [facility] at [facility] where [facility]R show distal phalanx fracture.  Digital block performed at [facility].  Transferred to [facility] for further care.  She reports no previous injury to thumb and no other injury at this time.  She states initially it did not hurt, then became a stabbing pain that she rates at 4/10.  Pain has been alleviated by digital block.    Case discussed with Debroah Baller, NP and her note from 09/10/2015 reviewed. Xrays viewed and interpreted by me: 3 views left thumb show distal phalanx transverse fracture with displacement proximal to tuft.  Soft tissue injury noted. Labs reviewed: WBC 5.4, Hgb 11.1  Allergies:  Allergies  Allergen Reactions  . Ace Inhibitors     Cough  . Amlodipine     Swelling  . Codeine     unknown  . Crestor [Rosuvastatin]     unknown  . Dramamine [Dimenhydrinate]     unknown  . Lipitor [Atorvastatin]     Constipation  . Quinine Derivatives     Hives  . Sulfa Antibiotics     unknown    Past Medical History  Diagnosis Date  . Hyperlipidemia   . Hypertension   . Diverticulosis   . Osteoporosis   . Prolapsed uterus   . High cholesterol     Past Surgical History  Procedure Laterality Date  . Abdominal hysterectomy      A+P repair 2007  . Back surgery      Family History: Family History  Problem Relation Age of Onset  . Heart disease Mother   . Diabetes Sister   . Hypertension Sister   . Hyperlipidemia Sister   . Cancer Maternal Aunt     lung    Social History:   reports that she has never smoked. She does not have any smokeless tobacco history on file. She reports that she does not drink alcohol or use illicit drugs.  Medications:  (Not in a hospital admission)  Results for orders placed or  performed during the hospital encounter of 09/10/15 (from the past 48 hour(s))  CBC with Differential/Platelet     Status: Abnormal   Collection Time: 09/10/15  6:25 PM  Result Value Ref Range   WBC 5.4 4.0 - 10.5 K/uL   RBC 3.76 (L) 3.87 - 5.11 MIL/uL   Hemoglobin 11.1 (L) 12.0 - 15.0 g/dL   HCT 32.9 (L) 36.0 - 46.0 %   MCV 87.5 78.0 - 100.0 fL   MCH 29.5 26.0 - 34.0 pg   MCHC 33.7 30.0 - 36.0 g/dL   RDW 14.2 11.5 - 15.5 %   Platelets 246 150 - 400 K/uL   Neutrophils Relative % 57 %   Neutro Abs 3.1 1.7 - 7.7 K/uL   Lymphocytes Relative 34 %   Lymphs Abs 1.8 0.7 - 4.0 K/uL   Monocytes Relative 5 %   Monocytes Absolute 0.3 0.1 - 1.0 K/uL   Eosinophils Relative 3 %   Eosinophils Absolute 0.2 0.0 - 0.7 K/uL   Basophils Relative 1 %   Basophils Absolute 0.0 0.0 - 0.1 K/uL  Basic metabolic panel     Status: Abnormal   Collection Time: 09/10/15  6:25 PM  Result Value Ref Range  Sodium 137 135 - 145 mmol/L   Potassium 3.8 3.5 - 5.1 mmol/L   Chloride 106 101 - 111 mmol/L   CO2 24 22 - 32 mmol/L   Glucose, Bld 95 65 - 99 mg/dL   BUN 30 (H) 6 - 20 mg/dL   Creatinine, Ser 0.99 0.44 - 1.00 mg/dL   Calcium 9.4 8.9 - 10.3 mg/dL   GFR calc non Af Amer 55 (L) >60 mL/min   GFR calc Af Amer >60 >60 mL/min    Comment: (NOTE) The eGFR has been calculated using the CKD EPI equation. This calculation has not been validated in all clinical situations. eGFR's persistently <60 mL/min signify possible Chronic Kidney Disease.    Anion gap 7 5 - 15    Dg Finger Thumb Left  09/10/2015  CLINICAL DATA:  Crush injury and distal following EXAM: LEFT THUMB 2+V COMPARISON:  None. FINDINGS: There is a transverse fracture through the first distal phalanx with associated soft tissue deformity. No other focal abnormality is seen. IMPRESSION: Traumatic amputation of the distal aspect of the first distal phalanx. Electronically Signed   By: Inez Catalina M.D.   On: 09/10/2015 18:41     A comprehensive  review of systems was negative. Review of Systems: No fevers, chills, night sweats, chest pain, shortness of breath, nausea, vomiting, diarrhea, constipation, easy bleeding or bruising, headaches, dizziness, vision changes, fainting.   Blood pressure 164/66, pulse 63, temperature 97.3 F (36.3 C), temperature source Oral, resp. rate 18, height '5\' 5"'$  (1.651 m), weight 58.968 kg (130 lb), SpO2 99 %.  General appearance: alert, cooperative and appears stated age Head: Normocephalic, without obvious abnormality, atraumatic Neck: supple, symmetrical, trachea midline Resp: clear to auscultation bilaterally Cardio: regular rate and rhythm GI: non-tender Extremities: Intact sensation and capillary refill all digits except left thumb which has decreased sensation secondary to digital block.  Thumb has brisk capillary refill.  +epl/fpl/io.  Laceration of thumb coursing through proximal nail bed with volar tissues intact.  Fracture of distal phalanx.  No gross contamination. Pulses: 2+ and symmetric Skin: Skin color, texture, turgor normal. No rashes or lesions Neurologic: Grossly normal Incision/Wound: As above  Assessment/Plan Left thumb crush injury with open distal phalanx fracture and nail injury.  Recommend OR for irrigation and debridement, pinning of fracture, and repair of skin and nail bed lacerations.  Risks, benefits, and alternatives of surgery were discussed and the patient agrees with the plan of care.  Tetanus updated by ED.  Ancef started in ED.   Timber Marshman R 09/10/2015, 9:47 PM

## 2015-09-10 NOTE — ED Notes (Signed)
Wet to dry dressing applied to left thumb.

## 2015-09-11 ENCOUNTER — Emergency Department (HOSPITAL_COMMUNITY): Payer: Commercial Managed Care - HMO | Admitting: Anesthesiology

## 2015-09-11 DIAGNOSIS — Z79899 Other long term (current) drug therapy: Secondary | ICD-10-CM | POA: Diagnosis not present

## 2015-09-11 DIAGNOSIS — S61112A Laceration without foreign body of left thumb with damage to nail, initial encounter: Secondary | ICD-10-CM | POA: Diagnosis not present

## 2015-09-11 DIAGNOSIS — I10 Essential (primary) hypertension: Secondary | ICD-10-CM | POA: Diagnosis not present

## 2015-09-11 DIAGNOSIS — Z23 Encounter for immunization: Secondary | ICD-10-CM | POA: Diagnosis not present

## 2015-09-11 DIAGNOSIS — E785 Hyperlipidemia, unspecified: Secondary | ICD-10-CM | POA: Diagnosis not present

## 2015-09-11 DIAGNOSIS — S68522A Partial traumatic transphalangeal amputation of left thumb, initial encounter: Secondary | ICD-10-CM | POA: Diagnosis not present

## 2015-09-11 DIAGNOSIS — S62522B Displaced fracture of distal phalanx of left thumb, initial encounter for open fracture: Secondary | ICD-10-CM | POA: Diagnosis not present

## 2015-09-11 DIAGNOSIS — S62522A Displaced fracture of distal phalanx of left thumb, initial encounter for closed fracture: Secondary | ICD-10-CM | POA: Diagnosis not present

## 2015-09-11 DIAGNOSIS — E7800 Pure hypercholesterolemia, unspecified: Secondary | ICD-10-CM | POA: Diagnosis not present

## 2015-09-11 DIAGNOSIS — Z7982 Long term (current) use of aspirin: Secondary | ICD-10-CM | POA: Diagnosis not present

## 2015-09-11 DIAGNOSIS — S6702XA Crushing injury of left thumb, initial encounter: Secondary | ICD-10-CM | POA: Diagnosis not present

## 2015-09-11 MED ORDER — ONDANSETRON HCL 4 MG/2ML IJ SOLN
INTRAMUSCULAR | Status: DC | PRN
  Administered 2015-09-11: 4 mg via INTRAVENOUS

## 2015-09-11 MED ORDER — PROPOFOL 10 MG/ML IV BOLUS
INTRAVENOUS | Status: DC | PRN
  Administered 2015-09-11: 140 mg via INTRAVENOUS

## 2015-09-11 MED ORDER — LIDOCAINE HCL (CARDIAC) 20 MG/ML IV SOLN
INTRAVENOUS | Status: DC | PRN
  Administered 2015-09-11: 70 mg via INTRAVENOUS

## 2015-09-11 MED ORDER — ONDANSETRON HCL 4 MG/2ML IJ SOLN
INTRAMUSCULAR | Status: AC
  Filled 2015-09-11: qty 2

## 2015-09-11 MED ORDER — ARTIFICIAL TEARS OP OINT
TOPICAL_OINTMENT | OPHTHALMIC | Status: AC
  Filled 2015-09-11: qty 3.5

## 2015-09-11 MED ORDER — DOXYCYCLINE HYCLATE 50 MG PO CAPS: 100.0000 mg | ORAL_CAPSULE | Freq: Two times a day (BID) | ORAL | Status: DC

## 2015-09-11 MED ORDER — EPHEDRINE 5 MG/ML INJ
INTRAVENOUS | Status: AC
Start: 2015-09-11 — End: 2015-09-11
  Filled 2015-09-11: qty 10

## 2015-09-11 MED ORDER — EPHEDRINE SULFATE 50 MG/ML IJ SOLN
INTRAMUSCULAR | Status: DC | PRN
  Administered 2015-09-11 (×2): 10 mg via INTRAVENOUS

## 2015-09-11 MED ORDER — FENTANYL CITRATE (PF) 100 MCG/2ML IJ SOLN
INTRAMUSCULAR | Status: DC | PRN
  Administered 2015-09-11: 50 ug via INTRAVENOUS
  Administered 2015-09-11 (×2): 25 ug via INTRAVENOUS

## 2015-09-11 MED ORDER — HYDROCODONE-ACETAMINOPHEN 5-325 MG PO TABS: ORAL_TABLET | ORAL | Status: DC

## 2015-09-11 MED ORDER — LACTATED RINGERS IV SOLN
INTRAVENOUS | Status: DC | PRN
  Administered 2015-09-11 (×2): via INTRAVENOUS

## 2015-09-11 MED ORDER — CEFAZOLIN SODIUM 1-5 GM-% IV SOLN
INTRAVENOUS | Status: DC | PRN
  Administered 2015-09-11: 1 g via INTRAVENOUS

## 2015-09-11 MED ORDER — BUPIVACAINE HCL (PF) 0.25 % IJ SOLN
INTRAMUSCULAR | Status: DC | PRN
  Administered 2015-09-11: 10 mL

## 2015-09-11 NOTE — Op Note (Signed)
Intra-operative fluoroscopic images in the [facility], lateral, and oblique views were taken and evaluated by myself.  Reduction and hardware placement were confirmed.  There was no intraarticular penetration of permanent hardware.  

## 2015-09-11 NOTE — Op Note (Signed)
[id number] 

## 2015-09-11 NOTE — Anesthesia Procedure Notes (Signed)
Procedure Name: LMA Insertion Date/Time: 09/11/2015 12:40 AM Performed by: Julianne RiceBILOTTA, Julia Igo Z Pre-anesthesia Checklist: Patient identified, Timeout performed, Emergency Drugs available, Suction available and Patient being monitored Patient Re-evaluated:Patient Re-evaluated prior to inductionPreoxygenation: Pre-oxygenation with 100% oxygen Intubation Type: IV induction Ventilation: Mask ventilation without difficulty LMA: LMA inserted LMA Size: 4.0 Tube type: Oral Number of attempts: 1 Placement Confirmation: breath sounds checked- equal and bilateral and positive ETCO2 Tube secured with: Tape Dental Injury: Teeth and Oropharynx as per pre-operative assessment

## 2015-09-11 NOTE — Brief Op Note (Signed)
09/10/2015 - 09/11/2015  1:37 AM  PATIENT:  Everardo BealsFrances M Roper  75 y.o. female  PRE-OPERATIVE DIAGNOSIS:  LEFT THUMB CRUSH INJURY  POST-OPERATIVE DIAGNOSIS:  Left Thumb crush injury  PROCEDURE:  Procedure(s): CLOSED REDUCTION FINGER WITH PERCUTANEOUS PINNING (Left) IRRIGATION AND DEBRIDEMENT THUMB, REPAIR OF SKIN AND NAIL BED (Left)  SURGEON:  Surgeon(s) and Role:    * Betha LoaKevin Owin Vignola, MD - Primary  PHYSICIAN ASSISTANT:   ASSISTANTS: none   ANESTHESIA:   general  EBL:  Total I/O In: 1000 [I.V.:1000] Out: -   BLOOD ADMINISTERED:none  DRAINS: none   LOCAL MEDICATIONS USED:  MARCAINE     SPECIMEN:  No Specimen  DISPOSITION OF SPECIMEN:  N/A  COUNTS:  YES  TOURNIQUET:   Total Tourniquet Time Documented: Upper Arm (Left) - 52 minutes Total: Upper Arm (Left) - 52 minutes   DICTATION: .Other Dictation: Dictation Number [id number]  PLAN OF CARE: Discharge to home after PACU  PATIENT DISPOSITION:  PACU - hemodynamically stable.

## 2015-09-11 NOTE — Op Note (Signed)
NAME:  CADANCE, RAUS             ACCOUNT NO.:  [id number]  MEDICAL RECORD NO.:  [id number]  LOCATION:  [facility]                         FACILITY:  [facility]  PHYSICIAN:  Betha Loa, MD        DATE OF BIRTH:  16-Jan-1941  DATE OF PROCEDURE:  09/11/2015 DATE OF DISCHARGE:                              OPERATIVE REPORT   PREOPERATIVE DIAGNOSES:  Left thumb crush injury with open distal phalanx fracture, skin and nailbed lacerations  POSTOPERATIVE DIAGNOSIS:  Left thumb crush injury with open distal phalanx fracture, skin and nailbed lacerations.  PROCEDURE:   1. Left thumb irrigation and debridement of open distal phalanx fracture including skin and subcutaneous tissues. 2. Open reduction and pin fixation of left thumb open distal phalanx fracture 3. Left thumb repair of nail bed laceration 4. Left thumb repair of radial intermediate skin laceration 1.5 cm 5. Left thumb repair of ulnar side complex skin laceration approximately 3 cm 6. Removal of left thumb nail plate.  SURGEON:  Betha Loa, MD.  ASSISTANT:  None.  ANESTHESIA:  General.  IV FLUIDS:  Anesthesia flow sheet.  ESTIMATED BLOOD LOSS:  Minimal.  COMPLICATIONS:  None.  SPECIMENS:  None.  TOURNIQUET TIME:  52 minutes.  DISPOSITION:  Stable to PACU.  INDICATIONS:  The patient is a 75 year old, right-hand-dominant female, who was helping load a pile on a truck today when the pile came down on her left thumb crushing the tip.  She has seen at [facility] Emergency Department were radiographs taken revealing distal phalanx fracture. She had a wound on both radial and ulnar sides of the thumb and across the nail bed.  She was transferred to [facility] for further care.  I recommended an irrigation, debridement, fixation repair in the operating room.  Risks, benefits, alternatives surgery were discussed including risk of blood loss; infection; damage to nerves, vessels, tendons, ligaments, bone; failure of surgery; need for  additional surgery; complications with wound healing; continued pain; nonunion; malunion; stiffness; and necrosis.  She voiced understanding of these risks and elected to proceed.  Her tetanus had been updated by the emergency department.  She was given a g of IV Ancef in the emergency department.  OPERATIVE COURSE:  After being identified preoperatively by myself, the patient and I agreed upon procedure and site of procedure.  Surgical site was marked.  The risks, benefits, and alternatives of surgery were reviewed and she wished to proceed.  Surgical consent had been signed. She was given another g of IV Ancef as preoperative antibiotic coverage. She was transferred to the operating room, placed on the operating table in the supine position, with left upper extremity on arm board.  General anesthesia was induced by Anesthesiology.  Left upper extremity was prepped and draped in normal sterile orthopedic fashion.  Surgical pause was performed between surgeons, anesthesia, operating staff, and all were in agreement as to the patient procedure and site of procedure. Tourniquet at the proximal aspect of the extremity was inflated to 250 mmHg after exsanguination of the limb with Esmarch bandage.  The wound was explored.  There was open distal phalanx fracture.  There was hematoma in the wound.  The radial and ulnar  neurovascular bundles appeared intact.  The ulnar bundle was very stretched but was intact. The nerve had trifurcated already.  The hematoma was removed.  The nail plate was removed with a Therapist, nutritionalreer elevator.  The wound was copiously irrigated with sterile saline by bulb syringe.  Skin and subcutaneous tissues were debrided sharply with the scissors to remove any devitalized skin and subcutaneous tissue.  The fracture was reduced under direct visualization.  Two 0.035-inch K-wires were advanced from the tip of the finger across the fracture site into the proximal aspect of the  distal phalanx.  They were not able to be advanced across the IP joint due to the shape of the distal phalanx.  This was adequate to stabilize the fracture however.  The skin lacerations were then repaired with 5-0 Monocryl suture in an interrupted fashion.  This provided good soft tissue apposition.  On the ulnar side there had been some skin loss.  Most of the tissue was able to be brought back together to cover neurovascular structures.  The nail bed was then repaired with 6-0 chromic in an interrupted fashion.  This provided good apposition of the soft tissues.  The pins were bent and cut short.  A piece of Xeroform was placed in nail fold and all wounds and pin sites dressed with sterile Xeroform, 4x4s, and wrapped with Coban dressing lightly. Alumafoam splint was placed and wrapped lightly with a Coban dressing. A digital block was performed.  A 10 mL of 0.25% plain Marcaine to aid in postoperative analgesia.  The tourniquet was deflated at 52 minutes. Fingertips were pink with brisk capillary refill after deflation of tourniquet.  Operative drapes were broken down.  The patient was awoken from anesthesia safely.  She was transferred back to stretcher and taken to PACU in stable condition.  I will see her back in the office in 1 week for postoperative followup.  I will give her Norco 5/325, 1-2 p.o. q.6 hours p.r.n. pain, dispensed #30 and Bactrim DS with doxycycline 100 mg p.o. b.i.d. x7 days due to the wound being open for a length of time.     Betha LoaKevin Tequilla Cousineau, MD     KK/MEDQ  D:  09/11/2015  T:  09/11/2015  Job:  [id number]

## 2015-09-11 NOTE — Transfer of Care (Signed)
Immediate Anesthesia Transfer of Care Note  Patient: Julia Robinson  Procedure(s) Performed: Procedure(s): CLOSED REDUCTION FINGER WITH PERCUTANEOUS PINNING (Left) IRRIGATION AND DEBRIDEMENT THUMB, REPAIR OF SKIN AND NAIL BED (Left)  Patient Location: PACU  Anesthesia Type:General  Level of Consciousness: awake, alert , oriented and patient cooperative  Airway & Oxygen Therapy: Patient Spontanous Breathing and Patient connected to nasal cannula oxygen  Post-op Assessment: Report given to RN and Post -op Vital signs reviewed and stable  Post vital signs: Reviewed and stable  Last Vitals:  Filed Vitals:   09/10/15 2200 09/11/15 0146  BP: 158/66   Pulse: 65 98  Temp:  36.3 C  Resp:  14    Last Pain:  Filed Vitals:   09/11/15 0148  PainSc: 3          Complications: No apparent anesthesia complications

## 2015-09-11 NOTE — Anesthesia Postprocedure Evaluation (Signed)
Anesthesia Post Note  Patient: Julia Robinson  Procedure(s) Performed: Procedure(s) (LRB): CLOSED REDUCTION FINGER WITH PERCUTANEOUS PINNING (Left) IRRIGATION AND DEBRIDEMENT THUMB, REPAIR OF SKIN AND NAIL BED (Left)  Patient location during evaluation: PACU Anesthesia Type: General Level of consciousness: awake and alert, awake and sedated Pain management: pain level controlled Vital Signs Assessment: post-procedure vital signs reviewed and stable Respiratory status: spontaneous breathing, nonlabored ventilation, respiratory function stable and patient connected to nasal cannula oxygen Cardiovascular status: blood pressure returned to baseline and stable Postop Assessment: no signs of nausea or vomiting Anesthetic complications: no    Last Vitals:  Filed Vitals:   09/10/15 2200 09/11/15 0146  BP: 158/66   Pulse: 65 98  Temp:  36.3 C  Resp:  14    Last Pain:  Filed Vitals:   09/11/15 0148  PainSc: 3                  [name],JAMES TERRILL

## 2015-09-11 NOTE — Discharge Instructions (Signed)

## 2015-09-14 ENCOUNTER — Encounter (HOSPITAL_COMMUNITY): Payer: Self-pay | Admitting: Orthopedic Surgery

## 2015-09-15 ENCOUNTER — Telehealth: Payer: Self-pay | Admitting: Family Medicine

## 2015-09-15 NOTE — Telephone Encounter (Signed)
For follow up with Dr Merlyn LotKuzma.

## 2015-09-16 NOTE — Telephone Encounter (Signed)
Referral approved Auth # [id number] for 6 visits from 09/16/15 - 03/04/16  For Dx [id number]  Dr Betha LoaKevin Kuzma.  Faxed to office.

## 2015-09-16 NOTE — Telephone Encounter (Signed)
Have submitted [employer] referral for pt with Dr Betha LoaKevin Kuzma for post op follow up of S68.022A (traumatic amputation of thumb).  Approval is pending.

## 2015-09-18 DIAGNOSIS — S61111D Laceration without foreign body of right thumb with damage to nail, subsequent encounter: Secondary | ICD-10-CM | POA: Diagnosis not present

## 2015-09-18 DIAGNOSIS — S62521D Displaced fracture of distal phalanx of right thumb, subsequent encounter for fracture with routine healing: Secondary | ICD-10-CM | POA: Diagnosis not present

## 2015-09-18 DIAGNOSIS — S61112D Laceration without foreign body of left thumb with damage to nail, subsequent encounter: Secondary | ICD-10-CM | POA: Diagnosis not present

## 2015-09-18 DIAGNOSIS — S62522D Displaced fracture of distal phalanx of left thumb, subsequent encounter for fracture with routine healing: Secondary | ICD-10-CM | POA: Insufficient documentation

## 2015-09-18 DIAGNOSIS — S61112A Laceration without foreign body of left thumb with damage to nail, initial encounter: Secondary | ICD-10-CM | POA: Insufficient documentation

## 2015-10-02 DIAGNOSIS — S62522D Displaced fracture of distal phalanx of left thumb, subsequent encounter for fracture with routine healing: Secondary | ICD-10-CM | POA: Diagnosis not present

## 2015-10-07 ENCOUNTER — Telehealth: Payer: Self-pay | Admitting: Family Medicine

## 2015-10-07 NOTE — Telephone Encounter (Signed)
Dr Dionicio StallKevin Kuzma's office calling stating emergency surgery done on pt 09/11/15 was not being paid.  [facility] because no referral had been submitted for procedure.  Pt had traumatic amputation of end of left thumb in an accident at her home late on 09/10/15.  Went to ED, surgery the next morning and pt released.  AFTER 2 HOURS of back and forth with [employer] and [employer], was finally sent to claims.  Spoke to FerdinandDenise there.  She looked up patient's account.  Said she saw claim for those dates and does not know why not paid.  [facility].  She is going to resubmit claim for payment.  Ref # for this call is [id number].  I called Steward DroneBrenda back at Dr Merrilee SeashoreKuzma's office and gave her this information.  CPT [id number], [id number] and [id number] (surgery) ICD S67.02XA, S62.522B and S61.112A

## 2015-10-16 DIAGNOSIS — S62522D Displaced fracture of distal phalanx of left thumb, subsequent encounter for fracture with routine healing: Secondary | ICD-10-CM | POA: Diagnosis not present

## 2015-11-24 ENCOUNTER — Ambulatory Visit (INDEPENDENT_AMBULATORY_CARE_PROVIDER_SITE_OTHER): Payer: Commercial Managed Care - HMO | Admitting: Family Medicine

## 2015-11-24 ENCOUNTER — Encounter: Payer: Self-pay | Admitting: Family Medicine

## 2015-11-24 VITALS — BP 130/68 | HR 72 | Temp 98.3°F | Resp 14 | Ht 60.0 in | Wt 128.0 lb

## 2015-11-24 DIAGNOSIS — I10 Essential (primary) hypertension: Secondary | ICD-10-CM | POA: Diagnosis not present

## 2015-11-24 DIAGNOSIS — E785 Hyperlipidemia, unspecified: Secondary | ICD-10-CM

## 2015-11-24 DIAGNOSIS — K921 Melena: Secondary | ICD-10-CM

## 2015-11-24 LAB — CBC WITH DIFFERENTIAL/PLATELET
BASOS ABS: 38 {cells}/uL (ref 0–200)
Basophils Relative: 1 %
EOS ABS: 76 {cells}/uL (ref 15–500)
EOS PCT: 2 %
HCT: 33.4 % — ABNORMAL LOW (ref 35.0–45.0)
Hemoglobin: 11 g/dL — ABNORMAL LOW (ref 12.0–15.0)
LYMPHS PCT: 30 %
Lymphs Abs: 1140 cells/uL (ref 850–3900)
MCH: 28.8 pg (ref 27.0–33.0)
MCHC: 32.9 g/dL (ref 32.0–36.0)
MCV: 87.4 fL (ref 80.0–100.0)
MONOS PCT: 8 %
MPV: 9 fL (ref 7.5–12.5)
Monocytes Absolute: 304 cells/uL (ref 200–950)
NEUTROS ABS: 2242 {cells}/uL (ref 1500–7800)
Neutrophils Relative %: 59 %
PLATELETS: 221 10*3/uL (ref 140–400)
RBC: 3.82 MIL/uL (ref 3.80–5.10)
RDW: 14.9 % (ref 11.0–15.0)
WBC: 3.8 10*3/uL (ref 3.8–10.8)

## 2015-11-24 LAB — LIPID PANEL
CHOL/HDL RATIO: 1.9 ratio (ref <=5.0)
CHOLESTEROL: 150 mg/dL (ref 125–200)
HDL: 79 mg/dL (ref >=46)
LDL Cholesterol: 58 mg/dL (ref <130)
Triglycerides: 65 mg/dL (ref <150)
VLDL: 13 mg/dL (ref <30)

## 2015-11-24 LAB — COMPLETE METABOLIC PANEL WITH GFR
ALBUMIN: 4.2 g/dL (ref 3.6–5.1)
ALT: 16 U/L (ref 6–29)
AST: 20 U/L (ref 10–35)
Alkaline Phosphatase: 70 U/L (ref 33–130)
BUN: 15 mg/dL (ref 7–25)
CALCIUM: 9.2 mg/dL (ref 8.6–10.4)
CHLORIDE: 104 mmol/L (ref 98–110)
CO2: 25 mmol/L (ref 20–31)
Creat: 0.92 mg/dL (ref 0.60–0.93)
GFR, Est African American: 71 mL/min (ref >=60)
GFR, Est Non African American: 62 mL/min (ref >=60)
GLUCOSE: 86 mg/dL (ref 70–99)
POTASSIUM: 3.7 mmol/L (ref 3.5–5.3)
SODIUM: 138 mmol/L (ref 135–146)
TOTAL PROTEIN: 6.5 g/dL (ref 6.1–8.1)
Total Bilirubin: 0.7 mg/dL (ref 0.2–1.2)

## 2015-11-24 NOTE — Progress Notes (Signed)
Subjective:    Patient ID: Julia Robinson, female    DOB: 08/11/1940, 75 y.o.   MRN: [id number]  HPI6/16 Patient is a 75 year old white female who is here today for complete physical exam. She is overdue for a mammogram as well as a colonoscopy.  Patient refuses colonoscopy as well as mammogram. She is due for Prevnar 13. She has had Pneumovax 23. She is also due for fasting lab work. Patient has a history of a hysterectomy and therefore does not require Pap smears. Her only complaint is persistent low back pain. She takes piroxicam intermittently which seems to control the pain she is requesting a refill on this. I cautioned the patient about peptic ulcer disease. I recommended she use the medication intermittently and she agrees.  At that time, my plan was: Patient's physical exam today is normal. Her blood pressure is normal. Patient received Prevnar 13 today in the clinic. I recommended a mammogram. Also recommended a colonoscopy. Patient declines both. She will consent to fecal occult blood cards 3. I've also asked her to return fasting for fasting lab work including a CBC, CMP, fasting lipid panel. Preventative care was discussed.  05/26/15 At that time, labs were nml except for mild anemia and 1/3 stool cards positive for blood.  I recommended GI consult.  She attributed the blood in her stool to milkshakes that she was drinking. She has not seen GI. She is here today for follow-up. She denies any chest pain shortness breath or dyspnea on exertion. She is due today to recheck her blood pressure along with her cholesterol.  At that time, my plan was: Her blood pressure is excellent. I will make no changes in her blood pressure medication at this time. I will check a fasting lipid panel. Her goal LDL cholesterol is less than 130. I will recheck a CBC to monitor her white blood cell count and her hemoglobin. If there has been a further drop in her hemoglobin, I would push the GI consultation for  active blood loss. If her hemoglobin is stable I will recommend doing cologuard as she is adamant that she does not want to proceed with colonoscopy at this time.  11/24/15 Patient never got cologuard due to cost.  She has not seen any blood in her stool. Her hemoglobin was stable and was checked at the hospital in June. Overall she has been doing good. She denies any chest pain shortness of breath or dyspnea on exertion. She is tolerating her statins without difficulty. Past Medical History:  Diagnosis Date  . Diverticulosis   . High cholesterol   . Hyperlipidemia   . Hypertension   . Osteoporosis   . Prolapsed uterus    Past Surgical History:  Procedure Laterality Date  . ABDOMINAL HYSTERECTOMY     A+P repair 2007  . BACK SURGERY    . CLOSED REDUCTION FINGER WITH PERCUTANEOUS PINNING Left 09/10/2015   Procedure: CLOSED REDUCTION FINGER WITH PERCUTANEOUS PINNING;  Surgeon: Betha LoaKevin Kuzma, MD;  Location: [facility] OR;  Service: Orthopedics;  Laterality: Left;  . I&D EXTREMITY Left 09/10/2015   Procedure: IRRIGATION AND DEBRIDEMENT THUMB, REPAIR OF SKIN AND NAIL BED;  Surgeon: Betha LoaKevin Kuzma, MD;  Location: [facility] OR;  Service: Orthopedics;  Laterality: Left;   Current Outpatient Prescriptions on File Prior to Visit  Medication Sig Dispense Refill  . aspirin 81 MG tablet Take 81 mg by mouth daily.    Marland Kitchen. doxazosin (CARDURA) 2 MG tablet Take 1 tablet (2 mg total)  by mouth daily. 90 tablet 4  . doxycycline (VIBRAMYCIN) 50 MG capsule Take 2 capsules (100 mg total) by mouth 2 (two) times daily. 28 capsule 0  . furosemide (LASIX) 20 MG tablet Take 20 mg by mouth daily as needed for fluid.     Marland Kitchen HYDROcodone-acetaminophen (NORCO) 5-325 MG tablet 1-2 tabs po q6 hours prn pain 30 tablet 0  . lovastatin (MEVACOR) 20 MG tablet Take 1 tablet (20 mg total) by mouth at bedtime. 90 tablet 4  . Magnesium 400 MG CAPS Take 1 tablet by mouth daily.     . piroxicam (FELDENE) 20 MG capsule TAKE ONE CAPSULE BY MOUTH ONCE DAILY  AS NEEDED FOR PAIN 30 capsule 1  . Potassium Gluconate 595 MG CAPS Take 1 capsule by mouth daily.      No current facility-administered medications on file prior to visit.    Allergies  Allergen Reactions  . Ace Inhibitors     Cough  . Amlodipine     Swelling  . Codeine     unknown  . Crestor [Rosuvastatin]     unknown  . Dramamine [Dimenhydrinate]     unknown  . Lipitor [Atorvastatin]     Constipation  . Quinine Derivatives     Hives  . Sulfa Antibiotics     unknown   Social History   Social History  . Marital status: Married    Spouse name: N/A  . Number of children: N/A  . Years of education: N/A   Occupational History  . Not on file.   Social History Main Topics  . Smoking status: Never Smoker  . Smokeless tobacco: Not on file  . Alcohol use No  . Drug use: No  . Sexual activity: Not on file   Other Topics Concern  . Not on file   Social History Narrative  . No narrative on file   Family History  Problem Relation Age of Onset  . Heart disease Mother   . Diabetes Sister   . Hypertension Sister   . Hyperlipidemia Sister   . Cancer Maternal Aunt     lung      Review of Systems  All other systems reviewed and are negative.      Objective:   Physical Exam  Constitutional: She is oriented to person, place, and time. She appears well-developed and well-nourished. No distress.  HENT:  Head: Normocephalic and atraumatic.  Right Ear: External ear normal.  Left Ear: External ear normal.  Nose: Nose normal.  Mouth/Throat: Oropharynx is clear and moist. No oropharyngeal exudate.  Eyes: Conjunctivae and EOM are normal. Pupils are equal, round, and reactive to light. Right eye exhibits no discharge. Left eye exhibits no discharge. No scleral icterus.  Neck: Normal range of motion. Neck supple. No JVD present. No tracheal deviation present. No thyromegaly present.  Cardiovascular: Normal rate, regular rhythm, normal heart sounds and intact distal  pulses.  Exam reveals no gallop and no friction rub.   No murmur heard. Pulmonary/Chest: Effort normal and breath sounds normal. No stridor. No respiratory distress. She has no wheezes. She has no rales. She exhibits no tenderness.  Abdominal: Soft. Bowel sounds are normal. She exhibits no distension and no mass. There is no tenderness. There is no rebound and no guarding.  Musculoskeletal: Normal range of motion. She exhibits no edema or tenderness.  Lymphadenopathy:    She has no cervical adenopathy.  Neurological: She is alert and oriented to person, place, and time. She has normal  reflexes. No cranial nerve deficit. She exhibits normal muscle tone. Coordination normal.  Skin: Skin is warm. No rash noted. She is not diaphoretic. No erythema. No pallor.  Psychiatric: She has a normal mood and affect. Her behavior is normal. Judgment and thought content normal.  Vitals reviewed.         Assessment & Plan:  HLD (hyperlipidemia) - Plan: CBC with Differential/Platelet, COMPLETE METABOLIC PANEL WITH GFR, Lipid panel  Benign essential HTN  Blood in stool - Plan: Fecal occult blood, imunochemical, Fecal occult blood, imunochemical, Fecal occult blood, imunochemical  Her blood pressure today is well controlled. I will check a fasting lipid panel. Her goal LDL cholesterol is less than 130. I will check a CBC to monitor her anemia. Patient agrees to perform fecal occult blood cards 3 again. If blood persists, she agrees to a colonoscopy

## 2015-11-27 ENCOUNTER — Other Ambulatory Visit: Payer: Commercial Managed Care - HMO

## 2015-11-27 ENCOUNTER — Other Ambulatory Visit: Payer: Self-pay | Admitting: Family Medicine

## 2015-11-27 DIAGNOSIS — K921 Melena: Secondary | ICD-10-CM | POA: Diagnosis not present

## 2015-11-28 LAB — FECAL OCCULT BLOOD, IMMUNOCHEMICAL
FECAL OCCULT BLOOD: POSITIVE — AB
Fecal Occult Blood: NEGATIVE
Fecal Occult Blood: NEGATIVE

## 2015-12-02 ENCOUNTER — Other Ambulatory Visit: Payer: Self-pay | Admitting: Family Medicine

## 2015-12-02 DIAGNOSIS — Z1211 Encounter for screening for malignant neoplasm of colon: Secondary | ICD-10-CM

## 2015-12-02 DIAGNOSIS — R195 Other fecal abnormalities: Secondary | ICD-10-CM

## 2015-12-09 ENCOUNTER — Other Ambulatory Visit: Payer: Self-pay | Admitting: Family Medicine

## 2015-12-17 ENCOUNTER — Ambulatory Visit (INDEPENDENT_AMBULATORY_CARE_PROVIDER_SITE_OTHER): Payer: Commercial Managed Care - HMO | Admitting: *Deleted

## 2015-12-17 DIAGNOSIS — Z23 Encounter for immunization: Secondary | ICD-10-CM

## 2015-12-17 NOTE — Progress Notes (Signed)
Patient seen in office for Influenza Vaccination.   Tolerated IM administration well.   Immunization history updated.  

## 2016-01-11 ENCOUNTER — Encounter: Payer: Self-pay | Admitting: Family Medicine

## 2016-02-17 ENCOUNTER — Encounter: Payer: Self-pay | Admitting: Family Medicine

## 2016-02-17 ENCOUNTER — Ambulatory Visit (INDEPENDENT_AMBULATORY_CARE_PROVIDER_SITE_OTHER): Payer: Commercial Managed Care - HMO | Admitting: Family Medicine

## 2016-02-17 VITALS — BP 154/70 | HR 68 | Temp 97.2°F | Resp 16 | Wt 133.0 lb

## 2016-02-17 DIAGNOSIS — L723 Sebaceous cyst: Secondary | ICD-10-CM

## 2016-02-17 NOTE — Progress Notes (Signed)
Subjective:    Patient ID: Julia BealsFrances M Pinard, female    DOB: 01/15/1941, 75 y.o.   MRN: [id number]  HPI Patient had as a sebaceous cyst on the back of her neck that has been there for approximately 10 years. It is roughly 1.5 cm in diameter. There is a blackhead at the center. It is well demarcated. She would like this excised. Past Medical History:  Diagnosis Date  . Diverticulosis   . High cholesterol   . Hyperlipidemia   . Hypertension   . Osteoporosis   . Prolapsed uterus    Past Surgical History:  Procedure Laterality Date  . ABDOMINAL HYSTERECTOMY     A+P repair 2007  . BACK SURGERY    . CLOSED REDUCTION FINGER WITH PERCUTANEOUS PINNING Left 09/10/2015   Procedure: CLOSED REDUCTION FINGER WITH PERCUTANEOUS PINNING;  Surgeon: Betha LoaKevin Kuzma, MD;  Location: [facility] OR;  Service: Orthopedics;  Laterality: Left;  . I&D EXTREMITY Left 09/10/2015   Procedure: IRRIGATION AND DEBRIDEMENT THUMB, REPAIR OF SKIN AND NAIL BED;  Surgeon: Betha LoaKevin Kuzma, MD;  Location: [facility] OR;  Service: Orthopedics;  Laterality: Left;   Current Outpatient Prescriptions on File Prior to Visit  Medication Sig Dispense Refill  . aspirin 81 MG tablet Take 81 mg by mouth daily.    Marland Kitchen. doxazosin (CARDURA) 2 MG tablet Take 1 tablet (2 mg total) by mouth daily. 90 tablet 4  . furosemide (LASIX) 20 MG tablet Take 20 mg by mouth daily as needed for fluid.     Marland Kitchen. lovastatin (MEVACOR) 20 MG tablet Take 1 tablet (20 mg total) by mouth at bedtime. 90 tablet 4  . Magnesium 400 MG CAPS Take 1 tablet by mouth daily.     . piroxicam (FELDENE) 20 MG capsule TAKE ONE CAPSULE BY MOUTH ONCE DAILY AS NEEDED FOR PAIN 30 capsule 1  . Potassium Gluconate 595 MG CAPS Take 1 capsule by mouth daily.      No current facility-administered medications on file prior to visit.    Allergies  Allergen Reactions  . Ace Inhibitors     Cough  . Amlodipine     Swelling  . Codeine     unknown  . Crestor [Rosuvastatin]     unknown  . Dramamine  [Dimenhydrinate]     unknown  . Lipitor [Atorvastatin]     Constipation  . Quinine Derivatives     Hives  . Sulfa Antibiotics     unknown   Social History   Social History  . Marital status: Married    Spouse name: N/A  . Number of children: N/A  . Years of education: N/A   Occupational History  . Not on file.   Social History Main Topics  . Smoking status: Never Smoker  . Smokeless tobacco: Former NeurosurgeonUser    Types: Chew  . Alcohol use No  . Drug use: No  . Sexual activity: Yes   Other Topics Concern  . Not on file   Social History Narrative  . No narrative on file      Review of Systems  All other systems reviewed and are negative.      Objective:   Physical Exam  Cardiovascular: Normal rate, regular rhythm, normal heart sounds and intact distal pulses.   Pulmonary/Chest: Effort normal and breath sounds normal.  Vitals reviewed.  See hpi       Assessment & Plan:  Sebaceous cyst  The lesion on the back of the neck was anesthetized 0.1% lidocaine  with epinephrine. She was then prepped and draped in sterile fashion. A vertical elliptical excision down to the subcutaneous fat was made around the lesion and it was removed in its entirety. The skin edges were then approximated with 4 simple interrupted 3-0 Ethilon sutures.  Minimal blood loss. When care was discussed. Stitches out in 7 days

## 2016-02-29 ENCOUNTER — Ambulatory Visit: Payer: Commercial Managed Care - HMO | Admitting: Family Medicine

## 2016-02-29 DIAGNOSIS — L723 Sebaceous cyst: Secondary | ICD-10-CM

## 2016-02-29 NOTE — Progress Notes (Signed)
Pt hd cyst removed by provider last week.  To return today to have sutures removed.  $ sutures intact to wound on back of neck.  Site clean and dry. Crusty scab in place.  No redness or drainage noted at site.  The 4 sutures were removed without difficulty.  Site remained intact.  Pt instructed to leave area clean and dry.  Scab will fall off on its own.  Call back if any swelling or drainage occurs at site.

## 2016-05-09 ENCOUNTER — Other Ambulatory Visit: Payer: Self-pay | Admitting: Family Medicine

## 2016-09-22 IMAGING — DX DG FINGER THUMB 2+V*L*
3 series · 3 of 3 positions shown · non-contrast
Comparison: None.

CLINICAL DATA: Crush injury and distal following

EXAM:
LEFT THUMB 2+V

[finger ap]
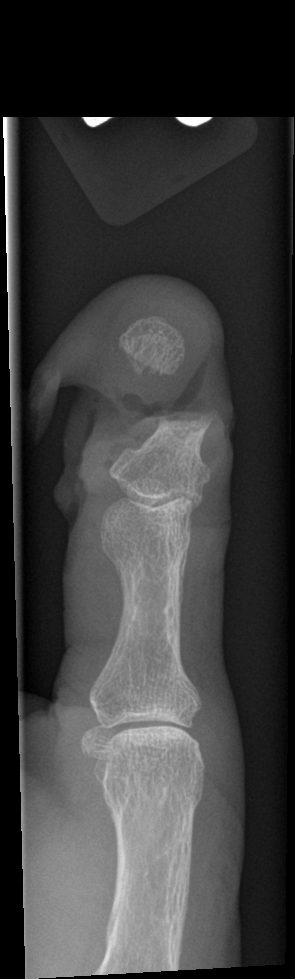

[finger obl]
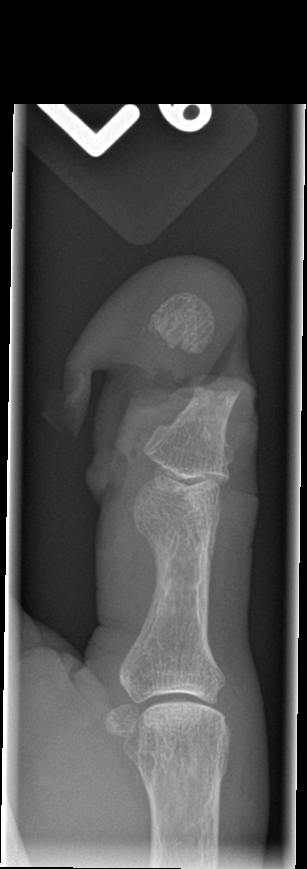

[finger lat]
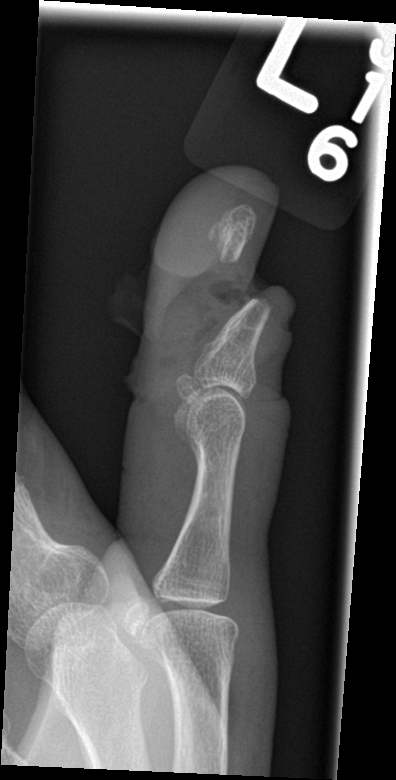

[3 of 3 positions shown; findings below may reference images not displayed]

FINDINGS: There is a transverse fracture through the first distal phalanx with
associated soft tissue deformity. No other focal abnormality is
seen.
IMPRESSION: Traumatic amputation of the distal aspect of the first distal
phalanx.

## 2016-11-21 ENCOUNTER — Other Ambulatory Visit: Payer: Self-pay | Admitting: Family Medicine

## 2016-12-27 ENCOUNTER — Ambulatory Visit (INDEPENDENT_AMBULATORY_CARE_PROVIDER_SITE_OTHER): Payer: Commercial Managed Care - HMO

## 2016-12-27 DIAGNOSIS — Z23 Encounter for immunization: Secondary | ICD-10-CM | POA: Diagnosis not present

## 2017-03-08 ENCOUNTER — Other Ambulatory Visit: Payer: Self-pay | Admitting: Family Medicine

## 2017-08-25 ENCOUNTER — Ambulatory Visit (INDEPENDENT_AMBULATORY_CARE_PROVIDER_SITE_OTHER): Payer: Medicare HMO | Admitting: Family Medicine

## 2017-08-25 ENCOUNTER — Encounter: Payer: Self-pay | Admitting: Family Medicine

## 2017-08-25 VITALS — BP 150/70 | HR 66 | Temp 97.8°F | Resp 16 | Ht 60.0 in | Wt 132.0 lb

## 2017-08-25 DIAGNOSIS — R195 Other fecal abnormalities: Secondary | ICD-10-CM

## 2017-08-25 DIAGNOSIS — M810 Age-related osteoporosis without current pathological fracture: Secondary | ICD-10-CM

## 2017-08-25 DIAGNOSIS — E7800 Pure hypercholesterolemia, unspecified: Secondary | ICD-10-CM | POA: Diagnosis not present

## 2017-08-25 DIAGNOSIS — I10 Essential (primary) hypertension: Secondary | ICD-10-CM | POA: Diagnosis not present

## 2017-08-25 DIAGNOSIS — Z1211 Encounter for screening for malignant neoplasm of colon: Secondary | ICD-10-CM

## 2017-08-25 LAB — COMPLETE METABOLIC PANEL WITH GFR
AG Ratio: 1.8 (calc) (ref 1.0–2.5)
ALBUMIN MSPROF: 4.1 g/dL (ref 3.6–5.1)
ALT: 17 U/L (ref 6–29)
AST: 19 U/L (ref 10–35)
Alkaline phosphatase (APISO): 67 U/L (ref 33–130)
BILIRUBIN TOTAL: 0.5 mg/dL (ref 0.2–1.2)
BUN / CREAT RATIO: 13 (calc) (ref 6–22)
BUN: 13 mg/dL (ref 7–25)
CHLORIDE: 107 mmol/L (ref 98–110)
CO2: 25 mmol/L (ref 20–32)
Calcium: 9.4 mg/dL (ref 8.6–10.4)
Creat: 1.01 mg/dL — ABNORMAL HIGH (ref 0.60–0.93)
GFR, EST AFRICAN AMERICAN: 64 mL/min/{1.73_m2} (ref >=60)
GFR, EST NON AFRICAN AMERICAN: 55 mL/min/{1.73_m2} — AB (ref >=60)
GLUCOSE: 83 mg/dL (ref 65–99)
Globulin: 2.3 g/dL (calc) (ref 1.9–3.7)
Potassium: 4.1 mmol/L (ref 3.5–5.3)
Sodium: 141 mmol/L (ref 135–146)
TOTAL PROTEIN: 6.4 g/dL (ref 6.1–8.1)

## 2017-08-25 LAB — CBC WITH DIFFERENTIAL/PLATELET
BASOS PCT: 0.9 %
Basophils Absolute: 32 cells/uL (ref 0–200)
EOS ABS: 98 {cells}/uL (ref 15–500)
Eosinophils Relative: 2.8 %
HCT: 33.8 % — ABNORMAL LOW (ref 35.0–45.0)
HEMOGLOBIN: 11.3 g/dL — AB (ref 11.7–15.5)
Lymphs Abs: 1204 cells/uL (ref 850–3900)
MCH: 29.4 pg (ref 27.0–33.0)
MCHC: 33.4 g/dL (ref 32.0–36.0)
MCV: 88 fL (ref 80.0–100.0)
MONOS PCT: 9.7 %
MPV: 9.8 fL (ref 7.5–12.5)
NEUTROS ABS: 1827 {cells}/uL (ref 1500–7800)
Neutrophils Relative %: 52.2 %
Platelets: 236 10*3/uL (ref 140–400)
RBC: 3.84 10*6/uL (ref 3.80–5.10)
RDW: 13.2 % (ref 11.0–15.0)
Total Lymphocyte: 34.4 %
WBC: 3.5 10*3/uL — ABNORMAL LOW (ref 3.8–10.8)
WBCMIX: 340 {cells}/uL (ref 200–950)

## 2017-08-25 LAB — LIPID PANEL
CHOL/HDL RATIO: 2.6 (calc) (ref <5.0)
Cholesterol: 159 mg/dL (ref <200)
HDL: 62 mg/dL (ref >50)
LDL CHOLESTEROL (CALC): 82 mg/dL
NON-HDL CHOLESTEROL (CALC): 97 mg/dL (ref <130)
Triglycerides: 71 mg/dL (ref <150)

## 2017-08-25 NOTE — Progress Notes (Signed)
Subjective:    Patient ID: Julia Robinson, female    DOB: 25-May-1940, 77 y.o.   MRN: [id number]  HPI6/16 Patient is a 77 year old white female who is here today for complete physical exam. She is overdue for a mammogram as well as a colonoscopy.  Patient refuses colonoscopy as well as mammogram. She is due for Prevnar 13. She has had Pneumovax 23. She is also due for fasting lab work. Patient has a history of a hysterectomy and therefore does not require Pap smears. Her only complaint is persistent low back pain. She takes piroxicam intermittently which seems to control the pain she is requesting a refill on this. I cautioned the patient about peptic ulcer disease. I recommended she use the medication intermittently and she agrees.  At that time, my plan was: Patient's physical exam today is normal. Her blood pressure is normal. Patient received Prevnar 13 today in the clinic. I recommended a mammogram. Also recommended a colonoscopy. Patient declines both. She will consent to fecal occult blood cards 3. I've also asked her to return fasting for fasting lab work including a CBC, CMP, fasting lipid panel. Preventative care was discussed.  05/26/15 At that time, labs were nml except for mild anemia and 1/3 stool cards positive for blood.  I recommended GI consult.  She attributed the blood in her stool to milkshakes that she was drinking. She has not seen GI. She is here today for follow-up. She denies any chest pain shortness breath or dyspnea on exertion. She is due today to recheck her blood pressure along with her cholesterol.  At that time, my plan was: Her blood pressure is excellent. I will make no changes in her blood pressure medication at this time. I will check a fasting lipid panel. Her goal LDL cholesterol is less than 130. I will recheck a CBC to monitor her white blood cell count and her hemoglobin. If there has been a further drop in her hemoglobin, I would push the GI consultation for  active blood loss. If her hemoglobin is stable I will recommend doing cologuard as she is adamant that she does not want to proceed with colonoscopy at this time.  11/24/15 Patient never got cologuard due to cost.  She has not seen any blood in her stool. Her hemoglobin was stable and was checked at the hospital in June. Overall she has been doing good. She denies any chest pain shortness of breath or dyspnea on exertion. She is tolerating her statins without difficulty.  At that time, my plan was: Her blood pressure today is well controlled. I will check a fasting lipid panel. Her goal LDL cholesterol is less than 130. I will check a CBC to monitor her anemia. Patient agrees to perform fecal occult blood cards 3 again. If blood persists, she agrees to a colonoscopy.  08/25/17 Patient was found to have blood in 1 out of 3 stool cards.  However she never had her colonoscopy.  She is now requesting the colonoscopy.  Overall she feels well.  She denies any chest pain shortness of breath or dyspnea on exertion.  She denies any myalgias or right upper quadrant pain.  She denies any acid reflux.  She is taking aspirin 81 mg a day in addition to Doan's which is aspirin back pain coupled with an arthritis medication.  I explained to the patient that this could likely irritate her stomach and lead to bleeding.  I recommended that she discontinue all aspirin products  but she could continue the arthritis medicine on a limited basis as needed for back pain.  She could take Tylenol on a daily basis.  She absolutely refuses a mammogram.  She does have a history of osteoporosis but is not currently on treatment.  She is due to recheck a bone density test.  Her blood pressure today is elevated.  She is compliant taking Cardura 2 mg a day Past Medical History:  Diagnosis Date  . Diverticulosis   . High cholesterol   . Hyperlipidemia   . Hypertension   . Osteoporosis   . Prolapsed uterus    Past Surgical History:    Procedure Laterality Date  . ABDOMINAL HYSTERECTOMY     A+P repair 2007  . BACK SURGERY    . CLOSED REDUCTION FINGER WITH PERCUTANEOUS PINNING Left 09/10/2015   Procedure: CLOSED REDUCTION FINGER WITH PERCUTANEOUS PINNING;  Surgeon: Betha Loa, MD;  Location: [facility] OR;  Service: Orthopedics;  Laterality: Left;  . I&D EXTREMITY Left 09/10/2015   Procedure: IRRIGATION AND DEBRIDEMENT THUMB, REPAIR OF SKIN AND NAIL BED;  Surgeon: Betha Loa, MD;  Location: [facility] OR;  Service: Orthopedics;  Laterality: Left;   Current Outpatient Medications on File Prior to Visit  Medication Sig Dispense Refill  . aspirin 81 MG tablet Take 81 mg by mouth daily.    Marland Kitchen doxazosin (CARDURA) 2 MG tablet TAKE 1 TABLET EVERY DAY 90 tablet 4  . furosemide (LASIX) 20 MG tablet Take 20 mg by mouth daily as needed for fluid.     Marland Kitchen lovastatin (MEVACOR) 20 MG tablet TAKE 1 TABLET AT BEDTIME 90 tablet 4  . Magnesium 400 MG CAPS Take 1 tablet by mouth daily.     . piroxicam (FELDENE) 20 MG capsule TAKE ONE CAPSULE BY MOUTH ONCE DAILY AS NEEDED FOR PAIN 30 capsule 1  . Potassium Gluconate 595 MG CAPS Take 1 capsule by mouth daily.      No current facility-administered medications on file prior to visit.    Allergies  Allergen Reactions  . Ace Inhibitors     Cough  . Amlodipine     Swelling  . Codeine     unknown  . Crestor [Rosuvastatin]     unknown  . Dramamine [Dimenhydrinate]     unknown  . Lipitor [Atorvastatin]     Constipation  . Quinine Derivatives     Hives  . Sulfa Antibiotics     unknown   Social History   Socioeconomic History  . Marital status: Married    Spouse name: Not on file  . Number of children: Not on file  . Years of education: Not on file  . Highest education level: Not on file  Occupational History  . Not on file  Social Needs  . Financial resource strain: Not on file  . Food insecurity:    Worry: Not on file    Inability: Not on file  . Transportation needs:    Medical: Not on  file    Non-medical: Not on file  Tobacco Use  . Smoking status: Never Smoker  . Smokeless tobacco: Former Neurosurgeon    Types: Chew  Substance and Sexual Activity  . Alcohol use: No  . Drug use: No  . Sexual activity: Yes  Lifestyle  . Physical activity:    Days per week: Not on file    Minutes per session: Not on file  . Stress: Not on file  Relationships  . Social connections:    Talks  on phone: Not on file    Gets together: Not on file    Attends religious service: Not on file    Active member of club or organization: Not on file    Attends meetings of clubs or organizations: Not on file    Relationship status: Not on file  . Intimate partner violence:    Fear of current or ex partner: Not on file    Emotionally abused: Not on file    Physically abused: Not on file    Forced sexual activity: Not on file  Other Topics Concern  . Not on file  Social History Narrative  . Not on file   Family History  Problem Relation Age of Onset  . Heart disease Mother   . Diabetes Sister   . Hypertension Sister   . Hyperlipidemia Sister   . Cancer Maternal Aunt        lung      Review of Systems  All other systems reviewed and are negative.      Objective:   Physical Exam  Constitutional: She is oriented to person, place, and time. She appears well-developed and well-nourished. No distress.  HENT:  Head: Normocephalic and atraumatic.  Right Ear: External ear normal.  Left Ear: External ear normal.  Nose: Nose normal.  Mouth/Throat: Oropharynx is clear and moist. No oropharyngeal exudate.  Eyes: Pupils are equal, round, and reactive to light. Conjunctivae and EOM are normal. Right eye exhibits no discharge. Left eye exhibits no discharge. No scleral icterus.  Neck: Normal range of motion. Neck supple. No JVD present. No tracheal deviation present. No thyromegaly present.  Cardiovascular: Normal rate, regular rhythm, normal heart sounds and intact distal pulses. Exam reveals no  gallop and no friction rub.  No murmur heard. Pulmonary/Chest: Effort normal and breath sounds normal. No stridor. No respiratory distress. She has no wheezes. She has no rales. She exhibits no tenderness.  Abdominal: Soft. Bowel sounds are normal. She exhibits no distension and no mass. There is no tenderness. There is no rebound and no guarding.  Musculoskeletal: Normal range of motion. She exhibits no edema or tenderness.  Lymphadenopathy:    She has no cervical adenopathy.  Neurological: She is alert and oriented to person, place, and time. She has normal reflexes. No cranial nerve deficit. She exhibits normal muscle tone. Coordination normal.  Skin: Skin is warm. No rash noted. She is not diaphoretic. No erythema. No pallor.  Psychiatric: She has a normal mood and affect. Her behavior is normal. Judgment and thought content normal.  Vitals reviewed.         Assessment & Plan:  Pure hypercholesterolemia  Hematest positive stools  Benign essential HTN - Plan: CBC with Differential/Platelet, COMPLETE METABOLIC PANEL WITH GFR, Lipid panel  Osteoporosis, unspecified osteoporosis type, unspecified pathological fracture presence - Plan: DG Bone Density  Colon cancer screening - Plan: Ambulatory referral to Gastroenterology  Recheck cholesterol today.  Goal LDL cholesterol is less than 130.  Monitor liver function test.  Blood pressure is elevated.  Patient will check her blood pressure every day for 1 week and then call me with the values.  If consistently greater than 140 systolic or over 90 diastolic, I would increase Cardura to 4 mg a day.  I will schedule the patient for a GI consultation for screening colonoscopy.  I will also schedule her for a bone density test to screen for osteoporosis.  She refuses a mammogram.  Her immunizations are up-to-date.

## 2017-08-29 ENCOUNTER — Encounter: Payer: Self-pay | Admitting: Gastroenterology

## 2017-10-24 ENCOUNTER — Ambulatory Visit: Payer: PRIVATE HEALTH INSURANCE | Admitting: Nurse Practitioner

## 2017-11-22 ENCOUNTER — Other Ambulatory Visit: Payer: Self-pay | Admitting: Family Medicine

## 2018-01-18 ENCOUNTER — Ambulatory Visit (INDEPENDENT_AMBULATORY_CARE_PROVIDER_SITE_OTHER): Payer: Medicare HMO | Admitting: Family Medicine

## 2018-01-18 DIAGNOSIS — Z23 Encounter for immunization: Secondary | ICD-10-CM | POA: Diagnosis not present

## 2018-01-31 ENCOUNTER — Other Ambulatory Visit: Payer: Self-pay | Admitting: Family Medicine

## 2018-06-12 ENCOUNTER — Other Ambulatory Visit: Payer: Self-pay | Admitting: Family Medicine

## 2018-10-06 ENCOUNTER — Other Ambulatory Visit: Payer: Self-pay | Admitting: Family Medicine

## 2018-12-19 ENCOUNTER — Other Ambulatory Visit: Payer: Self-pay | Admitting: Family Medicine

## 2018-12-25 ENCOUNTER — Other Ambulatory Visit: Payer: Self-pay

## 2018-12-25 ENCOUNTER — Ambulatory Visit (INDEPENDENT_AMBULATORY_CARE_PROVIDER_SITE_OTHER): Payer: Medicare HMO

## 2018-12-25 DIAGNOSIS — Z23 Encounter for immunization: Secondary | ICD-10-CM | POA: Diagnosis not present

## 2019-01-07 ENCOUNTER — Other Ambulatory Visit: Payer: Self-pay | Admitting: Family Medicine

## 2019-01-18 ENCOUNTER — Ambulatory Visit (INDEPENDENT_AMBULATORY_CARE_PROVIDER_SITE_OTHER): Payer: Medicare HMO | Admitting: Family Medicine

## 2019-01-18 ENCOUNTER — Other Ambulatory Visit: Payer: Self-pay

## 2019-01-18 VITALS — BP 140/78 | HR 77 | Temp 98.0°F | Resp 18 | Ht 64.0 in | Wt 130.0 lb

## 2019-01-18 DIAGNOSIS — M810 Age-related osteoporosis without current pathological fracture: Secondary | ICD-10-CM

## 2019-01-18 DIAGNOSIS — I10 Essential (primary) hypertension: Secondary | ICD-10-CM | POA: Diagnosis not present

## 2019-01-18 DIAGNOSIS — E7800 Pure hypercholesterolemia, unspecified: Secondary | ICD-10-CM

## 2019-01-18 DIAGNOSIS — R011 Cardiac murmur, unspecified: Secondary | ICD-10-CM | POA: Diagnosis not present

## 2019-01-18 NOTE — Progress Notes (Signed)
Subjective:    Patient ID: Julia Robinson, female    DOB: 06/29/1940, 78 y.o.   MRN: [id number]  Medication Refill  6/16 Patient is a 78 year old white female who is here today for complete physical exam. She is overdue for a mammogram as well as a colonoscopy.  Patient refuses colonoscopy as well as mammogram. She is due for Prevnar 13. She has had Pneumovax 23. She is also due for fasting lab work. Patient has a history of a hysterectomy and therefore does not require Pap smears. Her only complaint is persistent low back pain. She takes piroxicam intermittently which seems to control the pain she is requesting a refill on this. I cautioned the patient about peptic ulcer disease. I recommended she use the medication intermittently and she agrees.  At that time, my plan was: Patient's physical exam today is normal. Her blood pressure is normal. Patient received Prevnar 13 today in the clinic. I recommended a mammogram. Also recommended a colonoscopy. Patient declines both. She will consent to fecal occult blood cards 3. I've also asked her to return fasting for fasting lab work including a CBC, CMP, fasting lipid panel. Preventative care was discussed.  05/26/15 At that time, labs were nml except for mild anemia and 1/3 stool cards positive for blood.  I recommended GI consult.  She attributed the blood in her stool to milkshakes that she was drinking. She has not seen GI. She is here today for follow-up. She denies any chest pain shortness breath or dyspnea on exertion. She is due today to recheck her blood pressure along with her cholesterol.  At that time, my plan was: Her blood pressure is excellent. I will make no changes in her blood pressure medication at this time. I will check a fasting lipid panel. Her goal LDL cholesterol is less than 130. I will recheck a CBC to monitor her white blood cell count and her hemoglobin. If there has been a further drop in her hemoglobin, I would push the GI  consultation for active blood loss. If her hemoglobin is stable I will recommend doing cologuard as she is adamant that she does not want to proceed with colonoscopy at this time.  11/24/15 Patient never got cologuard due to cost.  She has not seen any blood in her stool. Her hemoglobin was stable and was checked at the hospital in June. Overall she has been doing good. She denies any chest pain shortness of breath or dyspnea on exertion. She is tolerating her statins without difficulty.  At that time, my plan was: Her blood pressure today is well controlled. I will check a fasting lipid panel. Her goal LDL cholesterol is less than 130. I will check a CBC to monitor her anemia. Patient agrees to perform fecal occult blood cards 3 again. If blood persists, she agrees to a colonoscopy.  08/25/17 Patient was found to have blood in 1 out of 3 stool cards.  However she never had her colonoscopy.  She is now requesting the colonoscopy.  Overall she feels well.  She denies any chest pain shortness of breath or dyspnea on exertion.  She denies any myalgias or right upper quadrant pain.  She denies any acid reflux.  She is taking aspirin 81 mg a day in addition to Doan's which is aspirin back pain coupled with an arthritis medication.  I explained to the patient that this could likely irritate her stomach and lead to bleeding.  I recommended that she discontinue  all aspirin products but she could continue the arthritis medicine on a limited basis as needed for back pain.  She could take Tylenol on a daily basis.  She absolutely refuses a mammogram.  She does have a history of osteoporosis but is not currently on treatment.  She is due to recheck a bone density test.  Her blood pressure today is elevated.  She is compliant taking Cardura 2 mg a day.  At that time, my plan was: Recheck cholesterol today.  Goal LDL cholesterol is less than 130.  Monitor liver function test.  Blood pressure is elevated.  Patient will  check her blood pressure every day for 1 week and then call me with the values.  If consistently greater than 093 systolic or over 90 diastolic, I would increase Cardura to 4 mg a day.  I will schedule the patient for a GI consultation for screening colonoscopy.  I will also schedule her for a bone density test to screen for osteoporosis.  She refuses a mammogram.  Her immunizations are up-to-date.  01/18/19 Patient is a very pleasant 78 year old Caucasian female here today for regular checkup.  She has a history of hypertension and she is on doxazosin 2 mg a day.  She previously took an ACE inhibitor that caused a cough.  Therefore she was placed on doxazosin as she had tried a prescription of that her husband had for this and it worked for her blood pressure.  She tolerated the medication and therefore she wanted to stay on it.  She denies any orthostatic dizziness on.  She denies any chest pain shortness of breath or dyspnea on exertion.  However today on physical exam she has a new onset 2/6 to 3/6 systolic ejection murmur that I have previously not heard over the pulmonic valve.  She denies any syncope or near syncope or shortness of breath or dyspnea on exertion.  She denies any chest pain.  She is also due for a check of her fasting lipid panel.  She has a history of osteoporosis from a remote bone density test that I have no record of.  She is not on therapy as she has denied this in the past.  She is also not taking calcium and vitamin D.  She is long overdue for a bone density test.  She refuses a colonoscopy.  I schedule this for her last time and she canceled it.  We did discuss this again and she again refuses a colonoscopy.  She is also overdue for mammogram.  She politely refuses this as well.  She does not want to perform any cancer screening.  Overall she is doing well with no concerns Past Medical History:  Diagnosis Date  . Diverticulosis   . High cholesterol   . Hyperlipidemia   .  Hypertension   . Osteoporosis   . Prolapsed uterus    Past Surgical History:  Procedure Laterality Date  . ABDOMINAL HYSTERECTOMY     A+P repair 2007  . BACK SURGERY    . CLOSED REDUCTION FINGER WITH PERCUTANEOUS PINNING Left 09/10/2015   Procedure: CLOSED REDUCTION FINGER WITH PERCUTANEOUS PINNING;  Surgeon: Leanora Cover, MD;  Location: [facility];  Service: Orthopedics;  Laterality: Left;  . I&D EXTREMITY Left 09/10/2015   Procedure: IRRIGATION AND DEBRIDEMENT THUMB, REPAIR OF SKIN AND NAIL BED;  Surgeon: Leanora Cover, MD;  Location: [facility];  Service: Orthopedics;  Laterality: Left;   Current Outpatient Medications on File Prior to Visit  Medication Sig  Dispense Refill  . aspirin 81 MG tablet Take 81 mg by mouth daily.    Marland Kitchen doxazosin (CARDURA) 2 MG tablet TAKE 1 TABLET EVERY DAY 30 tablet 0  . furosemide (LASIX) 20 MG tablet Take 20 mg by mouth daily as needed for fluid.     Marland Kitchen lovastatin (MEVACOR) 20 MG tablet TAKE 1 TABLET AT BEDTIME 30 tablet 0  . Magnesium 400 MG CAPS Take 1 tablet by mouth daily.     . piroxicam (FELDENE) 20 MG capsule TAKE 1 CAPSULE BY MOUTH ONCE DAILY AS NEEDED FOR PAIN 30 capsule 0  . Potassium Gluconate 595 MG CAPS Take 1 capsule by mouth daily.      No current facility-administered medications on file prior to visit.    Allergies  Allergen Reactions  . Ace Inhibitors     Cough  . Amlodipine     Swelling  . Codeine     unknown  . Crestor [Rosuvastatin]     unknown  . Dramamine [Dimenhydrinate]     unknown  . Lipitor [Atorvastatin]     Constipation  . Quinine Derivatives     Hives  . Sulfa Antibiotics     unknown   Social History   Socioeconomic History  . Marital status: Married    Spouse name: Not on file  . Number of children: Not on file  . Years of education: Not on file  . Highest education level: Not on file  Occupational History  . Not on file  Social Needs  . Financial resource strain: Not on file  . Food insecurity    Worry: Not on  file    Inability: Not on file  . Transportation needs    Medical: Not on file    Non-medical: Not on file  Tobacco Use  . Smoking status: Never Smoker  . Smokeless tobacco: Former Neurosurgeon    Types: Chew  Substance and Sexual Activity  . Alcohol use: No  . Drug use: No  . Sexual activity: Yes  Lifestyle  . Physical activity    Days per week: Not on file    Minutes per session: Not on file  . Stress: Not on file  Relationships  . Social Musician on phone: Not on file    Gets together: Not on file    Attends religious service: Not on file    Active member of club or organization: Not on file    Attends meetings of clubs or organizations: Not on file    Relationship status: Not on file  . Intimate partner violence    Fear of current or ex partner: Not on file    Emotionally abused: Not on file    Physically abused: Not on file    Forced sexual activity: Not on file  Other Topics Concern  . Not on file  Social History Narrative  . Not on file   Family History  Problem Relation Age of Onset  . Heart disease Mother   . Diabetes Sister   . Hypertension Sister   . Hyperlipidemia Sister   . Cancer Maternal Aunt        lung      Review of Systems  All other systems reviewed and are negative.      Objective:   Physical Exam  Constitutional: She is oriented to person, place, and time. She appears well-developed and well-nourished. No distress.  HENT:  Head: Normocephalic and atraumatic.  Right Ear: External ear  normal.  Left Ear: External ear normal.  Nose: Nose normal.  Mouth/Throat: Oropharynx is clear and moist. No oropharyngeal exudate.  Eyes: Pupils are equal, round, and reactive to light. Conjunctivae and EOM are normal. Right eye exhibits no discharge. Left eye exhibits no discharge. No scleral icterus.  Neck: Normal range of motion. Neck supple. No JVD present. No tracheal deviation present. No thyromegaly present.  Cardiovascular: Normal rate,  regular rhythm and intact distal pulses. Exam reveals no gallop and no friction rub.  Murmur heard. Pulmonary/Chest: Effort normal and breath sounds normal. No stridor. No respiratory distress. She has no wheezes. She has no rales. She exhibits no tenderness.  Abdominal: Soft. Bowel sounds are normal. She exhibits no distension and no mass. There is no abdominal tenderness. There is no rebound and no guarding.  Musculoskeletal: Normal range of motion.        General: No tenderness or edema.  Lymphadenopathy:    She has no cervical adenopathy.  Neurological: She is alert and oriented to person, place, and time. She has normal reflexes. No cranial nerve deficit. She exhibits normal muscle tone. Coordination normal.  Skin: Skin is warm. No rash noted. She is not diaphoretic. No erythema. No pallor.  Psychiatric: She has a normal mood and affect. Her behavior is normal. Judgment and thought content normal.  Vitals reviewed.         Assessment & Plan:  Benign essential HTN - Plan: CBC with Differential/Platelet, COMPLETE METABOLIC PANEL WITH GFR, Lipid panel  Pure hypercholesterolemia - Plan: CBC with Differential/Platelet, COMPLETE METABOLIC PANEL WITH GFR, Lipid panel  Osteoporosis, unspecified osteoporosis type, unspecified pathological fracture presence - Plan: DG Bone Density  Murmur, cardiac - Plan: ECHOCARDIOGRAM COMPLETE  Given the new onset murmur I have recommended an echocardiogram to evaluate the cause.  Her blood pressure today is acceptable at 140/78.  Continue Cardura 2 mg a day.  Check a CMP and a fasting lipid panel.  Goal LDL cholesterol is less than 100.  Recommended 1200 mg a day of calcium and 1000 units a day of vitamin D for her osteoporosis.  We will schedule a bone density test and if osteoporosis is again confirmed, I would recommend trying Prolia.  She did not tolerate Fosamax in the past per her report.  She refuses a mammogram.  She refuses a colonoscopy.  Her flu  shot and pneumonia vaccines are up-to-date.

## 2019-01-19 LAB — LIPID PANEL
Cholesterol: 146 mg/dL (ref <200)
HDL: 58 mg/dL (ref >=50)
LDL Cholesterol (Calc): 69 mg/dL (calc)
Non-HDL Cholesterol (Calc): 88 mg/dL (calc) (ref <130)
Total CHOL/HDL Ratio: 2.5 (calc) (ref <5.0)
Triglycerides: 109 mg/dL (ref <150)

## 2019-01-19 LAB — CBC WITH DIFFERENTIAL/PLATELET
Absolute Monocytes: 382 cells/uL (ref 200–950)
Basophils Absolute: 32 cells/uL (ref 0–200)
Basophils Relative: 0.7 %
Eosinophils Absolute: 110 cells/uL (ref 15–500)
Eosinophils Relative: 2.4 %
HCT: 34.4 % — ABNORMAL LOW (ref 35.0–45.0)
Hemoglobin: 11.4 g/dL — ABNORMAL LOW (ref 11.7–15.5)
Lymphs Abs: 1348 cells/uL (ref 850–3900)
MCH: 29.9 pg (ref 27.0–33.0)
MCHC: 33.1 g/dL (ref 32.0–36.0)
MCV: 90.3 fL (ref 80.0–100.0)
MPV: 9.6 fL (ref 7.5–12.5)
Monocytes Relative: 8.3 %
Neutro Abs: 2728 cells/uL (ref 1500–7800)
Neutrophils Relative %: 59.3 %
Platelets: 263 10*3/uL (ref 140–400)
RBC: 3.81 10*6/uL (ref 3.80–5.10)
RDW: 13.4 % (ref 11.0–15.0)
Total Lymphocyte: 29.3 %
WBC: 4.6 10*3/uL (ref 3.8–10.8)

## 2019-01-19 LAB — COMPLETE METABOLIC PANEL WITH GFR
AG Ratio: 1.7 (calc) (ref 1.0–2.5)
ALT: 14 U/L (ref 6–29)
AST: 17 U/L (ref 10–35)
Albumin: 4.3 g/dL (ref 3.6–5.1)
Alkaline phosphatase (APISO): 70 U/L (ref 37–153)
BUN/Creatinine Ratio: 13 (calc) (ref 6–22)
BUN: 13 mg/dL (ref 7–25)
CO2: 24 mmol/L (ref 20–32)
Calcium: 9.8 mg/dL (ref 8.6–10.4)
Chloride: 106 mmol/L (ref 98–110)
Creat: 1.02 mg/dL — ABNORMAL HIGH (ref 0.60–0.93)
GFR, Est African American: 62 mL/min/{1.73_m2} (ref >=60)
GFR, Est Non African American: 53 mL/min/{1.73_m2} — ABNORMAL LOW (ref >=60)
Globulin: 2.6 g/dL (calc) (ref 1.9–3.7)
Glucose, Bld: 95 mg/dL (ref 65–99)
Potassium: 4.3 mmol/L (ref 3.5–5.3)
Sodium: 141 mmol/L (ref 135–146)
Total Bilirubin: 0.7 mg/dL (ref 0.2–1.2)
Total Protein: 6.9 g/dL (ref 6.1–8.1)

## 2019-01-28 ENCOUNTER — Other Ambulatory Visit: Payer: Self-pay

## 2019-01-28 ENCOUNTER — Ambulatory Visit (HOSPITAL_COMMUNITY): Payer: Medicare HMO | Attending: Internal Medicine

## 2019-01-28 DIAGNOSIS — R011 Cardiac murmur, unspecified: Secondary | ICD-10-CM

## 2019-01-29 ENCOUNTER — Encounter: Payer: Self-pay | Admitting: Family Medicine

## 2019-02-16 ENCOUNTER — Other Ambulatory Visit: Payer: Self-pay | Admitting: Family Medicine

## 2019-04-09 ENCOUNTER — Other Ambulatory Visit: Payer: Self-pay | Admitting: Family Medicine

## 2019-05-13 ENCOUNTER — Other Ambulatory Visit: Payer: Medicare HMO

## 2019-05-24 ENCOUNTER — Other Ambulatory Visit: Payer: Self-pay | Admitting: Family Medicine

## 2019-06-05 ENCOUNTER — Other Ambulatory Visit: Payer: Self-pay | Admitting: Family Medicine

## 2019-06-17 ENCOUNTER — Other Ambulatory Visit: Payer: Self-pay | Admitting: Family Medicine

## 2019-06-26 ENCOUNTER — Other Ambulatory Visit: Payer: Self-pay | Admitting: Family Medicine

## 2019-08-08 ENCOUNTER — Telehealth: Payer: Self-pay | Admitting: Family Medicine

## 2019-08-08 NOTE — Chronic Care Management (AMB) (Signed)
  Chronic Care Management   Outreach Note  08/08/2019 Name: Julia Robinson MRN: [id number] DOB: Jul 03, 1940  Referred by: [name], MD Reason for referral : Chronic Care Management   An unsuccessful telephone outreach was attempted today. The patient was referred to the pharmacist for assistance with care management and care coordination.   Follow Up Plan:   Myra Cody  Up[facility] Scheduler

## 2019-09-04 ENCOUNTER — Telehealth: Payer: Self-pay | Admitting: Family Medicine

## 2019-09-04 NOTE — Progress Notes (Signed)
°  Chronic Care Management   Note  09/04/2019 Name: Julia Robinson MRN: [id number] DOB: 10/07/1940  Julia Robinson is a 79 y.o. year old female who is a primary care patient of [name], MD. I reached out to Everardo Beals by phone today in response to a referral sent by [name]'s PCP, [name], MD.   [name] was given information about Chronic Care Management services today including:  1. CCM service includes personalized support from designated clinical staff supervised by her physician, including individualized plan of care and coordination with other care providers 2. 24/7 contact phone numbers for assistance for urgent and routine care needs. 3. Service will only be billed when office clinical staff spend 20 minutes or more in a month to coordinate care. 4. Only one practitioner may furnish and bill the service in a calendar month. 5. The patient may stop CCM services at any time (effective at the end of the month) by phone call to the office staff.   Patient agreed to services and verbal consent obtained.   Follow up plan:  Alvie Heidelberg [facility] Scheduler

## 2019-10-28 ENCOUNTER — Other Ambulatory Visit: Payer: Self-pay

## 2019-10-28 ENCOUNTER — Other Ambulatory Visit: Payer: Medicare HMO

## 2019-10-28 ENCOUNTER — Other Ambulatory Visit: Payer: Self-pay | Admitting: Family Medicine

## 2019-10-28 DIAGNOSIS — E7800 Pure hypercholesterolemia, unspecified: Secondary | ICD-10-CM

## 2019-10-28 DIAGNOSIS — Z0000 Encounter for general adult medical examination without abnormal findings: Secondary | ICD-10-CM | POA: Diagnosis not present

## 2019-10-28 DIAGNOSIS — I10 Essential (primary) hypertension: Secondary | ICD-10-CM

## 2019-10-28 LAB — LIPID PANEL
Cholesterol: 153 mg/dL (ref <200)
HDL: 51 mg/dL (ref >=50)
LDL Cholesterol (Calc): 81 mg/dL (calc)
Non-HDL Cholesterol (Calc): 102 mg/dL (calc) (ref <130)
Total CHOL/HDL Ratio: 3 (calc) (ref <5.0)
Triglycerides: 111 mg/dL (ref <150)

## 2019-10-28 LAB — CBC WITH DIFFERENTIAL/PLATELET
Absolute Monocytes: 392 cells/uL (ref 200–950)
Basophils Absolute: 50 cells/uL (ref 0–200)
Basophils Relative: 1.1 %
Eosinophils Absolute: 131 cells/uL (ref 15–500)
Eosinophils Relative: 2.9 %
HCT: 31.4 % — ABNORMAL LOW (ref 35.0–45.0)
Hemoglobin: 10.3 g/dL — ABNORMAL LOW (ref 11.7–15.5)
Lymphs Abs: 1350 cells/uL (ref 850–3900)
MCH: 29.3 pg (ref 27.0–33.0)
MCHC: 32.8 g/dL (ref 32.0–36.0)
MCV: 89.5 fL (ref 80.0–100.0)
MPV: 9.5 fL (ref 7.5–12.5)
Monocytes Relative: 8.7 %
Neutro Abs: 2579 cells/uL (ref 1500–7800)
Neutrophils Relative %: 57.3 %
Platelets: 297 10*3/uL (ref 140–400)
RBC: 3.51 10*6/uL — ABNORMAL LOW (ref 3.80–5.10)
RDW: 13.7 % (ref 11.0–15.0)
Total Lymphocyte: 30 %
WBC: 4.5 10*3/uL (ref 3.8–10.8)

## 2019-10-28 LAB — COMPLETE METABOLIC PANEL WITH GFR
AG Ratio: 1.7 (calc) (ref 1.0–2.5)
ALT: 9 U/L (ref 6–29)
AST: 14 U/L (ref 10–35)
Albumin: 3.8 g/dL (ref 3.6–5.1)
Alkaline phosphatase (APISO): 57 U/L (ref 37–153)
BUN/Creatinine Ratio: 8 (calc) (ref 6–22)
BUN: 9 mg/dL (ref 7–25)
CO2: 26 mmol/L (ref 20–32)
Calcium: 8.9 mg/dL (ref 8.6–10.4)
Chloride: 104 mmol/L (ref 98–110)
Creat: 1.12 mg/dL — ABNORMAL HIGH (ref 0.60–0.93)
GFR, Est African American: 55 mL/min/{1.73_m2} — ABNORMAL LOW (ref >=60)
GFR, Est Non African American: 47 mL/min/{1.73_m2} — ABNORMAL LOW (ref >=60)
Globulin: 2.3 g/dL (calc) (ref 1.9–3.7)
Glucose, Bld: 84 mg/dL (ref 65–99)
Potassium: 4 mmol/L (ref 3.5–5.3)
Sodium: 138 mmol/L (ref 135–146)
Total Bilirubin: 0.5 mg/dL (ref 0.2–1.2)
Total Protein: 6.1 g/dL (ref 6.1–8.1)

## 2019-10-31 ENCOUNTER — Telehealth: Payer: Medicare HMO

## 2019-11-05 ENCOUNTER — Other Ambulatory Visit: Payer: Self-pay | Admitting: Family Medicine

## 2019-11-14 ENCOUNTER — Other Ambulatory Visit: Payer: Self-pay

## 2019-11-14 ENCOUNTER — Ambulatory Visit (INDEPENDENT_AMBULATORY_CARE_PROVIDER_SITE_OTHER): Payer: Medicare HMO | Admitting: Family Medicine

## 2019-11-14 VITALS — BP 120/68 | HR 79 | Temp 96.2°F | Ht 64.0 in | Wt 131.0 lb

## 2019-11-14 DIAGNOSIS — D649 Anemia, unspecified: Secondary | ICD-10-CM | POA: Diagnosis not present

## 2019-11-14 DIAGNOSIS — E7800 Pure hypercholesterolemia, unspecified: Secondary | ICD-10-CM

## 2019-11-14 DIAGNOSIS — M810 Age-related osteoporosis without current pathological fracture: Secondary | ICD-10-CM | POA: Diagnosis not present

## 2019-11-14 DIAGNOSIS — I351 Nonrheumatic aortic (valve) insufficiency: Secondary | ICD-10-CM | POA: Diagnosis not present

## 2019-11-14 DIAGNOSIS — Z0001 Encounter for general adult medical examination with abnormal findings: Secondary | ICD-10-CM

## 2019-11-14 DIAGNOSIS — I10 Essential (primary) hypertension: Secondary | ICD-10-CM

## 2019-11-14 DIAGNOSIS — Z0000 Encounter for general adult medical examination without abnormal findings: Secondary | ICD-10-CM

## 2019-11-14 DIAGNOSIS — R6889 Other general symptoms and signs: Secondary | ICD-10-CM | POA: Diagnosis not present

## 2019-11-14 LAB — VITAMIN B12: Vitamin B-12: 213 pg/mL (ref 200–1100)

## 2019-11-14 LAB — FERRITIN: Ferritin: 36 ng/mL (ref 16–288)

## 2019-11-14 NOTE — Progress Notes (Signed)
Subjective:    Patient ID: Julia BealsFrances M Rohl, female    DOB: 02/16/1941, 79 y.o.   MRN: [id number]   Patient is a very pleasant 79 year old Caucasian female here today for regular checkup. She is due for mammogram. She refuses a mammogram. She is due for a bone density test. She refuses to take a bone density test. She is taking calcium and vitamin D however. She is long overdue for colonoscopy. She declines a colonoscopy. I reviewed her immunizations. She is due for the shingles vaccine along with the Covid vaccine. She declines them both. I spent more than 10 minutes with both she and her husband trying to convince them to get the Covid shot. I answered all their questions as best I could however she still declines getting the Covid vaccination. She denies any falls, depression, or memory loss. Her most recent lab work is listed below: Lab on 10/28/2019  Component Date Value Ref Range Status  . Cholesterol 10/28/2019 153  <200 mg/dL Final  . HDL 40/98/119108/04/2019 51  > OR = 50 mg/dL Final  . Triglycerides 10/28/2019 111  <150 mg/dL Final  . LDL Cholesterol (Calc) 10/28/2019 81  mg/dL (calc) Final   Comment: Reference range: <100 . Desirable range <100 mg/dL for primary prevention;   <70 mg/dL for patients with CHD or diabetic patients  with > or = 2 CHD risk factors. Marland Kitchen. LDL-C is now calculated using the Martin-Hopkins  calculation, which is a validated novel method providing  better accuracy than the Friedewald equation in the  estimation of LDL-C.  Horald PollenMartin SS et al. Lenox AhrJAMA. 4782;956(212013;310(19): 2061-2068  (http://education.[facility].com/faq/FAQ164)   . Total CHOL/HDL Ratio 10/28/2019 3.0  <3.0<5.0 (calc) Final  . Non-HDL Cholesterol (Calc) 10/28/2019 102  <130 mg/dL (calc) Final   Comment: For patients with diabetes plus 1 major ASCVD risk  factor, treating to a non-HDL-C goal of <100 mg/dL  (LDL-C of <86<70 mg/dL) is considered a therapeutic  option.   . WBC 10/28/2019 4.5  3.8 - 10.8 Thousand/uL  Final  . RBC 10/28/2019 3.51* 3.80 - 5.10 Million/uL Final  . Hemoglobin 10/28/2019 10.3* 11.7 - 15.5 g/dL Final  . HCT 57/84/696208/04/2019 31.4* 35 - 45 % Final  . MCV 10/28/2019 89.5  80.0 - 100.0 fL Final  . MCH 10/28/2019 29.3  27.0 - 33.0 pg Final  . MCHC 10/28/2019 32.8  32.0 - 36.0 g/dL Final  . RDW 95/28/413208/04/2019 13.7  11.0 - 15.0 % Final  . Platelets 10/28/2019 297  140 - 400 Thousand/uL Final  . MPV 10/28/2019 9.5  7.5 - 12.5 fL Final  . Neutro Abs 10/28/2019 2,579  1,500 - 7,800 cells/uL Final  . Lymphs Abs 10/28/2019 1,350  850 - 3,900 cells/uL Final  . Absolute Monocytes 10/28/2019 392  200 - 950 cells/uL Final  . Eosinophils Absolute 10/28/2019 131  15 - 500 cells/uL Final  . Basophils Absolute 10/28/2019 50  0 - 200 cells/uL Final  . Neutrophils Relative % 10/28/2019 57.3  % Final  . Total Lymphocyte 10/28/2019 30.0  % Final  . Monocytes Relative 10/28/2019 8.7  % Final  . Eosinophils Relative 10/28/2019 2.9  % Final  . Basophils Relative 10/28/2019 1.1  % Final  . Glucose, Bld 10/28/2019 84  65 - 99 mg/dL Final   Comment: .            Fasting reference interval .   . BUN 10/28/2019 9  7 - 25 mg/dL Final  . Creat 44/01/027208/04/2019 1.12* 0.60 -  0.93 mg/dL Final   Comment: For patients >46 years of age, the reference limit for Creatinine is approximately 13% higher for people identified as African-American. .   . GFR, Est Non African American 10/28/2019 47* > OR = 60 mL/min/1.71m2 Final  . GFR, Est African American 10/28/2019 55* > OR = 60 mL/min/1.9m2 Final  . BUN/Creatinine Ratio 10/28/2019 8  6 - 22 (calc) Final  . Sodium 10/28/2019 138  135 - 146 mmol/L Final  . Potassium 10/28/2019 4.0  3.5 - 5.3 mmol/L Final  . Chloride 10/28/2019 104  98 - 110 mmol/L Final  . CO2 10/28/2019 26  20 - 32 mmol/L Final  . Calcium 10/28/2019 8.9  8.6 - 10.4 mg/dL Final  . Total Protein 10/28/2019 6.1  6.1 - 8.1 g/dL Final  . Albumin 63/87/5643 3.8  3.6 - 5.1 g/dL Final  . Globulin 32/95/1884 2.3   1.9 - 3.7 g/dL (calc) Final  . AG Ratio 10/28/2019 1.7  1.0 - 2.5 (calc) Final  . Total Bilirubin 10/28/2019 0.5  0.2 - 1.2 mg/dL Final  . Alkaline phosphatase (APISO) 10/28/2019 57  37 - 153 U/L Final  . AST 10/28/2019 14  10 - 35 U/L Final  . ALT 10/28/2019 9  6 - 29 U/L Final    Past Medical History:  Diagnosis Date  . Diverticulosis   . High cholesterol   . Hyperlipidemia   . Hypertension   . Moderate aortic regurgitation    with mild aortic stenosis echo 10/20  . Osteoporosis   . Prolapsed uterus    Past Surgical History:  Procedure Laterality Date  . ABDOMINAL HYSTERECTOMY     A+P repair 2007  . BACK SURGERY    . CLOSED REDUCTION FINGER WITH PERCUTANEOUS PINNING Left 09/10/2015   Procedure: CLOSED REDUCTION FINGER WITH PERCUTANEOUS PINNING;  Surgeon: Betha Loa, MD;  Location: [facility] OR;  Service: Orthopedics;  Laterality: Left;  . I & D EXTREMITY Left 09/10/2015   Procedure: IRRIGATION AND DEBRIDEMENT THUMB, REPAIR OF SKIN AND NAIL BED;  Surgeon: Betha Loa, MD;  Location: [facility] OR;  Service: Orthopedics;  Laterality: Left;   Current Outpatient Medications on File Prior to Visit  Medication Sig Dispense Refill  . aspirin 81 MG tablet Take 81 mg by mouth daily.    Marland Kitchen doxazosin (CARDURA) 2 MG tablet TAKE 1 TABLET EVERY DAY 90 tablet 0  . furosemide (LASIX) 20 MG tablet Take 20 mg by mouth daily as needed for fluid.     Marland Kitchen lovastatin (MEVACOR) 20 MG tablet TAKE 1 TABLET AT BEDTIME (NEED MD APPOINTMENT FOR REFILLS) 90 tablet 1  . Magnesium 400 MG CAPS Take 1 tablet by mouth daily.     . piroxicam (FELDENE) 20 MG capsule TAKE 1 CAPSULE BY MOUTH ONCE DAILY AS NEEDED FOR PAIN 30 capsule 0  . Potassium Gluconate 595 MG CAPS Take 1 capsule by mouth daily.      No current facility-administered medications on file prior to visit.   Allergies  Allergen Reactions  . Ace Inhibitors     Cough  . Amlodipine     Swelling  . Codeine     unknown  . Crestor [Rosuvastatin]     unknown  .  Dramamine [Dimenhydrinate]     unknown  . Lipitor [Atorvastatin]     Constipation  . Quinine Derivatives     Hives  . Sulfa Antibiotics     unknown   Social History   Socioeconomic History  . Marital status:  Married    Spouse name: Not on file  . Number of children: Not on file  . Years of education: Not on file  . Highest education level: Not on file  Occupational History  . Not on file  Tobacco Use  . Smoking status: Never Smoker  . Smokeless tobacco: Former Neurosurgeon    Types: Chew  Substance and Sexual Activity  . Alcohol use: No  . Drug use: No  . Sexual activity: Yes  Other Topics Concern  . Not on file  Social History Narrative  . Not on file   Social Determinants of Health   Financial Resource Strain:   . Difficulty of Paying Living Expenses: Not on file  Food Insecurity:   . Worried About Programme researcher, broadcasting/film/video in the Last Year: Not on file  . Ran Out of Food in the Last Year: Not on file  Transportation Needs:   . Lack of Transportation (Medical): Not on file  . Lack of Transportation (Non-Medical): Not on file  Physical Activity:   . Days of Exercise per Week: Not on file  . Minutes of Exercise per Session: Not on file  Stress:   . Feeling of Stress : Not on file  Social Connections:   . Frequency of Communication with Friends and Family: Not on file  . Frequency of Social Gatherings with Friends and Family: Not on file  . Attends Religious Services: Not on file  . Active Member of Clubs or Organizations: Not on file  . Attends Banker Meetings: Not on file  . Marital Status: Not on file  Intimate Partner Violence:   . Fear of Current or Ex-Partner: Not on file  . Emotionally Abused: Not on file  . Physically Abused: Not on file  . Sexually Abused: Not on file   Family History  Problem Relation Age of Onset  . Heart disease Mother   . Diabetes Sister   . Hypertension Sister   . Hyperlipidemia Sister   . Cancer Maternal Aunt         lung      Review of Systems  All other systems reviewed and are negative.      Objective:   Physical Exam Vitals reviewed.  Constitutional:      General: She is not in acute distress.    Appearance: She is well-developed. She is not diaphoretic.  HENT:     Head: Normocephalic and atraumatic.     Right Ear: External ear normal.     Left Ear: External ear normal.     Nose: Nose normal.     Mouth/Throat:     Pharynx: No oropharyngeal exudate.  Eyes:     General: No scleral icterus.       Right eye: No discharge.        Left eye: No discharge.     Conjunctiva/sclera: Conjunctivae normal.     Pupils: Pupils are equal, round, and reactive to light.  Neck:     Thyroid: No thyromegaly.     Vascular: No JVD.     Trachea: No tracheal deviation.  Cardiovascular:     Rate and Rhythm: Normal rate and regular rhythm.     Heart sounds: Murmur heard.  No friction rub. No gallop.   Pulmonary:     Effort: Pulmonary effort is normal. No respiratory distress.     Breath sounds: Normal breath sounds. No stridor. No wheezing or rales.  Chest:     Chest wall: No  tenderness.  Abdominal:     General: Bowel sounds are normal. There is no distension.     Palpations: Abdomen is soft. There is no mass.     Tenderness: There is no abdominal tenderness. There is no guarding or rebound.  Musculoskeletal:        General: No tenderness. Normal range of motion.     Cervical back: Normal range of motion and neck supple.  Lymphadenopathy:     Cervical: No cervical adenopathy.  Skin:    General: Skin is warm.     Coloration: Skin is not pale.     Findings: No erythema or rash.  Neurological:     Mental Status: She is alert and oriented to person, place, and time.     Cranial Nerves: No cranial nerve deficit.     Motor: No abnormal muscle tone.     Coordination: Coordination normal.     Deep Tendon Reflexes: Reflexes are normal and symmetric.  Psychiatric:        Behavior: Behavior normal.         Thought Content: Thought content normal.        Judgment: Judgment normal.           Assessment & Plan:   Anemia, unspecified type - Plan: Fecal Globin By Immunochemistry, Vitamin B12, Ferritin  Benign essential HTN  Pure hypercholesterolemia  Osteoporosis, unspecified osteoporosis type, unspecified pathological fracture presence  General medical exam  Nonrheumatic aortic valve insufficiency  Echocardiogram last year revealed mild to moderate aortic regurgitation. Patient denies any syncope or near syncope or chest pain or shortness of breath with activity. Therefore it is asymptomatic. Murmur has not changed perceptibly since last year. I recommended a mammogram, colonoscopy, and bone density test. The patient declined all three. Her lab work is significant for a drop in her hemoglobin. Therefore I will check a ferritin level as well as a vitamin B12 level. Also check her stool for blood. Blood pressure today is excellent. Cholesterol is excellent. Strongly, strongly recommended COVID-19 vaccination however the patient declined. Also recommended the shingles vaccine. Patient denies any falls, depression, or memory loss

## 2019-11-15 ENCOUNTER — Other Ambulatory Visit: Payer: Medicare HMO

## 2019-11-16 LAB — FECAL GLOBIN BY IMMUNOCHEMISTRY
FECAL GLOBIN RESULT:: NOT DETECTED
MICRO NUMBER:: 10852879
SPECIMEN QUALITY:: ADEQUATE

## 2019-11-20 ENCOUNTER — Other Ambulatory Visit: Payer: Self-pay | Admitting: *Deleted

## 2019-11-20 DIAGNOSIS — D649 Anemia, unspecified: Secondary | ICD-10-CM

## 2019-11-20 MED ORDER — FERROUS SULFATE 325 (65 FE) MG PO TABS: 325.0000 mg | ORAL_TABLET | Freq: Every day | ORAL | 3 refills | Status: DC

## 2019-11-20 MED ORDER — CYANOCOBALAMIN 1000 MCG PO CAPS: 1.0000 | ORAL_CAPSULE | Freq: Every day | ORAL | Status: AC

## 2019-12-09 ENCOUNTER — Other Ambulatory Visit: Payer: Self-pay | Admitting: Family Medicine

## 2019-12-10 NOTE — Telephone Encounter (Signed)
Requested Prescriptions   Pending Prescriptions Disp Refills  . piroxicam (FELDENE) 20 MG capsule [Pharmacy Med Name: Piroxicam 20 MG Oral Capsule] 30 capsule 0    Sig: TAKE 1 CAPSULE BY MOUTH ONCE DAILY AS NEEDED FOR PAIN     Last OV 11/14/2019   Last written 06/05/2019

## 2020-03-02 ENCOUNTER — Other Ambulatory Visit: Payer: Self-pay | Admitting: Family Medicine

## 2020-05-11 ENCOUNTER — Other Ambulatory Visit: Payer: Self-pay | Admitting: Family Medicine

## 2020-05-18 ENCOUNTER — Other Ambulatory Visit: Payer: Self-pay | Admitting: Family Medicine

## 2020-06-10 ENCOUNTER — Encounter: Payer: Self-pay | Admitting: Family Medicine

## 2020-06-10 ENCOUNTER — Ambulatory Visit (INDEPENDENT_AMBULATORY_CARE_PROVIDER_SITE_OTHER): Payer: Medicare HMO | Admitting: Family Medicine

## 2020-06-10 ENCOUNTER — Other Ambulatory Visit: Payer: Self-pay

## 2020-06-10 VITALS — BP 130/66 | HR 66 | Temp 98.0°F | Resp 16 | Ht 64.0 in | Wt 137.0 lb

## 2020-06-10 DIAGNOSIS — E7800 Pure hypercholesterolemia, unspecified: Secondary | ICD-10-CM | POA: Diagnosis not present

## 2020-06-10 DIAGNOSIS — I10 Essential (primary) hypertension: Secondary | ICD-10-CM | POA: Diagnosis not present

## 2020-06-10 DIAGNOSIS — I351 Nonrheumatic aortic (valve) insufficiency: Secondary | ICD-10-CM | POA: Diagnosis not present

## 2020-06-10 LAB — CBC WITH DIFFERENTIAL/PLATELET
Absolute Monocytes: 478 cells/uL (ref 200–950)
Basophils Absolute: 59 cells/uL (ref 0–200)
Basophils Relative: 1 %
Eosinophils Absolute: 212 cells/uL (ref 15–500)
Eosinophils Relative: 3.6 %
HCT: 33.3 % — ABNORMAL LOW (ref 35.0–45.0)
Hemoglobin: 10.9 g/dL — ABNORMAL LOW (ref 11.7–15.5)
Lymphs Abs: 1457 cells/uL (ref 850–3900)
MCH: 29.6 pg (ref 27.0–33.0)
MCHC: 32.7 g/dL (ref 32.0–36.0)
MCV: 90.5 fL (ref 80.0–100.0)
MPV: 9.2 fL (ref 7.5–12.5)
Monocytes Relative: 8.1 %
Neutro Abs: 3693 cells/uL (ref 1500–7800)
Neutrophils Relative %: 62.6 %
Platelets: 335 10*3/uL (ref 140–400)
RBC: 3.68 10*6/uL — ABNORMAL LOW (ref 3.80–5.10)
RDW: 13.2 % (ref 11.0–15.0)
Total Lymphocyte: 24.7 %
WBC: 5.9 10*3/uL (ref 3.8–10.8)

## 2020-06-10 LAB — COMPLETE METABOLIC PANEL WITH GFR
AG Ratio: 1.7 (calc) (ref 1.0–2.5)
ALT: 11 U/L (ref 6–29)
AST: 15 U/L (ref 10–35)
Albumin: 4.2 g/dL (ref 3.6–5.1)
Alkaline phosphatase (APISO): 57 U/L (ref 37–153)
BUN/Creatinine Ratio: 9 (calc) (ref 6–22)
BUN: 11 mg/dL (ref 7–25)
CO2: 30 mmol/L (ref 20–32)
Calcium: 9.6 mg/dL (ref 8.6–10.4)
Chloride: 103 mmol/L (ref 98–110)
Creat: 1.18 mg/dL — ABNORMAL HIGH (ref 0.60–0.93)
GFR, Est African American: 52 mL/min/{1.73_m2} — ABNORMAL LOW (ref >=60)
GFR, Est Non African American: 44 mL/min/{1.73_m2} — ABNORMAL LOW (ref >=60)
Globulin: 2.5 g/dL (calc) (ref 1.9–3.7)
Glucose, Bld: 93 mg/dL (ref 65–99)
Potassium: 4.2 mmol/L (ref 3.5–5.3)
Sodium: 141 mmol/L (ref 135–146)
Total Bilirubin: 0.4 mg/dL (ref 0.2–1.2)
Total Protein: 6.7 g/dL (ref 6.1–8.1)

## 2020-06-10 LAB — LIPID PANEL
Cholesterol: 160 mg/dL (ref <200)
HDL: 57 mg/dL (ref >=50)
LDL Cholesterol (Calc): 80 mg/dL (calc)
Non-HDL Cholesterol (Calc): 103 mg/dL (calc) (ref <130)
Total CHOL/HDL Ratio: 2.8 (calc) (ref <5.0)
Triglycerides: 136 mg/dL (ref <150)

## 2020-06-10 NOTE — Progress Notes (Signed)
Subjective:    Patient ID: Julia Robinson, female    DOB: 1940/07/13, 80 y.o.   MRN: [id number]  8/21 Patient is a very pleasant 80 year old Caucasian female here today for regular checkup. She is due for mammogram. She refuses a mammogram. She is due for a bone density test. She refuses to take a bone density test. She is taking calcium and vitamin D however. She is long overdue for colonoscopy. She declines a colonoscopy. I reviewed her immunizations. She is due for the shingles vaccine along with the Covid vaccine. She declines them both. I spent more than 10 minutes with both she and her husband trying to convince them to get the Covid shot. I answered all their questions as best I could however she still declines getting the Covid vaccination. She denies any falls, depression, or memory loss.  At that time, my plan was: Echocardiogram last year revealed mild to moderate aortic regurgitation. Patient denies any syncope or near syncope or chest pain or shortness of breath with activity. Therefore it is asymptomatic. Murmur has not changed perceptibly since last year. I recommended a mammogram, colonoscopy, and bone density test. The patient declined all three. Her lab work is significant for a drop in her hemoglobin. Therefore I will check a ferritin level as well as a vitamin B12 level. Also check her stool for blood. Blood pressure today is excellent. Cholesterol is excellent. Strongly, strongly recommended COVID-19 vaccination however the patient declined. Also recommended the shingles vaccine. Patient denies any falls, depression, or memory loss 06/10/20 Patient is a very sweet 80 year old Caucasian female who presents today for follow-up.  On her echocardiogram in 2020 she was found to have moderate aortic regurgitation with mild aortic stenosis.  She denies any syncope or near syncope.  She denies any chest pain.  She denies any shortness of breath with activity.  She denies any paroxysmal  nocturnal dyspnea or orthopnea.  On her exam today there is no pitting edema in extremities.  The murmur has not intensified.  Overall she is doing well.  Her blood pressure is excellent at 130/66.  She is due to recheck fasting lab work.  Past Medical History:  Diagnosis Date  . Diverticulosis   . High cholesterol   . Hyperlipidemia   . Hypertension   . Moderate aortic regurgitation    with mild aortic stenosis echo 10/20  . Osteoporosis   . Prolapsed uterus    Past Surgical History:  Procedure Laterality Date  . ABDOMINAL HYSTERECTOMY     A+P repair 2007  . BACK SURGERY    . CLOSED REDUCTION FINGER WITH PERCUTANEOUS PINNING Left 09/10/2015   Procedure: CLOSED REDUCTION FINGER WITH PERCUTANEOUS PINNING;  Surgeon: Betha Loa, MD;  Location: [facility] OR;  Service: Orthopedics;  Laterality: Left;  . I & D EXTREMITY Left 09/10/2015   Procedure: IRRIGATION AND DEBRIDEMENT THUMB, REPAIR OF SKIN AND NAIL BED;  Surgeon: Betha Loa, MD;  Location: [facility] OR;  Service: Orthopedics;  Laterality: Left;   Current Outpatient Medications on File Prior to Visit  Medication Sig Dispense Refill  . aspirin 81 MG tablet Take 81 mg by mouth daily.    . Calcium Carbonate-Vitamin D3 (CALCIUM 600-D) 600-400 MG-UNIT TABS Take by mouth.    . Cyanocobalamin 1000 MCG CAPS Take 1 capsule by mouth daily.    Marland Kitchen doxazosin (CARDURA) 2 MG tablet TAKE 1 TABLET EVERY DAY (REQUIRES OFFICE VISIT BEFORE ANY FURTHER REFILLS CAN BE GIVEN) 90 tablet 0  . ferrous  sulfate (FERROUSUL) 325 (65 FE) MG tablet Take 1 tablet (325 mg total) by mouth daily with breakfast.  3  . furosemide (LASIX) 20 MG tablet Take 20 mg by mouth daily as needed for fluid.     Marland Kitchen lovastatin (MEVACOR) 20 MG tablet TAKE 1 TABLET AT BEDTIME (NEED MD APPOINTMENT FOR REFILLS) 90 tablet 1  . Magnesium 400 MG CAPS Take 1 tablet by mouth daily.     . Magnesium Oxide 400 MG CAPS Take by mouth.    . piroxicam (FELDENE) 20 MG capsule TAKE 1 CAPSULE BY MOUTH ONCE DAILY AS  NEEDED FOR PAIN 30 capsule 0  . Potassium Gluconate 595 MG CAPS Take 1 capsule by mouth daily.      No current facility-administered medications on file prior to visit.   Allergies  Allergen Reactions  . Ace Inhibitors     Cough  . Amlodipine     Swelling  . Codeine     unknown  . Crestor [Rosuvastatin]     unknown  . Dramamine [Dimenhydrinate]     unknown  . Lipitor [Atorvastatin]     Constipation  . Quinine Derivatives     Hives  . Sulfa Antibiotics     unknown   Social History   Socioeconomic History  . Marital status: Married    Spouse name: Not on file  . Number of children: Not on file  . Years of education: Not on file  . Highest education level: Not on file  Occupational History  . Not on file  Tobacco Use  . Smoking status: Never Smoker  . Smokeless tobacco: Former Neurosurgeon    Types: Chew  Substance and Sexual Activity  . Alcohol use: No  . Drug use: No  . Sexual activity: Yes  Other Topics Concern  . Not on file  Social History Narrative  . Not on file   Social Determinants of Health   Financial Resource Strain: Not on file  Food Insecurity: Not on file  Transportation Needs: Not on file  Physical Activity: Not on file  Stress: Not on file  Social Connections: Not on file  Intimate Partner Violence: Not on file   Family History  Problem Relation Age of Onset  . Heart disease Mother   . Diabetes Sister   . Hypertension Sister   . Hyperlipidemia Sister   . Cancer Maternal Aunt        lung      Review of Systems  All other systems reviewed and are negative.      Objective:   Physical Exam Vitals reviewed.  Constitutional:      General: She is not in acute distress.    Appearance: She is well-developed. She is not diaphoretic.  HENT:     Head: Normocephalic and atraumatic.     Right Ear: External ear normal.     Left Ear: External ear normal.     Nose: Nose normal.     Mouth/Throat:     Pharynx: No oropharyngeal exudate.  Eyes:      General: No scleral icterus.       Right eye: No discharge.        Left eye: No discharge.     Conjunctiva/sclera: Conjunctivae normal.     Pupils: Pupils are equal, round, and reactive to light.  Neck:     Thyroid: No thyromegaly.     Vascular: No JVD.     Trachea: No tracheal deviation.  Cardiovascular:  Rate and Rhythm: Normal rate and regular rhythm.     Heart sounds: Murmur heard.  No friction rub. No gallop.   Pulmonary:     Effort: Pulmonary effort is normal. No respiratory distress.     Breath sounds: Normal breath sounds. No stridor. No wheezing or rales.  Chest:     Chest wall: No tenderness.  Abdominal:     General: Bowel sounds are normal. There is no distension.     Palpations: Abdomen is soft. There is no mass.     Tenderness: There is no abdominal tenderness. There is no guarding or rebound.  Musculoskeletal:        General: No tenderness. Normal range of motion.     Cervical back: Normal range of motion and neck supple.  Lymphadenopathy:     Cervical: No cervical adenopathy.  Skin:    General: Skin is warm.     Coloration: Skin is not pale.     Findings: No erythema or rash.  Neurological:     Mental Status: She is alert and oriented to person, place, and time.     Cranial Nerves: No cranial nerve deficit.     Motor: No abnormal muscle tone.     Coordination: Coordination normal.     Deep Tendon Reflexes: Reflexes are normal and symmetric.  Psychiatric:        Behavior: Behavior normal.        Thought Content: Thought content normal.        Judgment: Judgment normal.           Assessment & Plan:  Benign essential HTN - Plan: CBC with Differential/Platelet, COMPLETE METABOLIC PANEL WITH GFR, Lipid panel  Pure hypercholesterolemia - Plan: CBC with Differential/Platelet, COMPLETE METABOLIC PANEL WITH GFR, Lipid panel  Nonrheumatic aortic valve insufficiency   Spent the majority of our time today discussing her aortic insufficiency.  At the  present time she is asymptomatic.  I would like to repeat her echocardiogram this fall when I see her.  We are currently seeing her on a 42-month schedule so we will discuss that again this fall.  At the present time she is asymptomatic and her murmur has not changed therefore I believe that we can simply monitor her clinically at the present time but I will plan on repeating her echocardiogram in the fall.  Her blood pressure today is acceptable.  Check a fasting lipid panel.  Goal LDL cholesterol is less than 100.

## 2020-06-18 ENCOUNTER — Encounter: Payer: Self-pay | Admitting: *Deleted

## 2020-07-31 ENCOUNTER — Other Ambulatory Visit: Payer: Self-pay | Admitting: Family Medicine

## 2020-09-22 ENCOUNTER — Other Ambulatory Visit: Payer: Self-pay | Admitting: Family Medicine

## 2020-10-08 ENCOUNTER — Ambulatory Visit (INDEPENDENT_AMBULATORY_CARE_PROVIDER_SITE_OTHER): Payer: Medicare HMO | Admitting: Family Medicine

## 2020-10-08 ENCOUNTER — Other Ambulatory Visit: Payer: Self-pay

## 2020-10-08 ENCOUNTER — Encounter: Payer: Self-pay | Admitting: Family Medicine

## 2020-10-08 VITALS — BP 130/72 | HR 70 | Temp 98.2°F | Resp 14 | Ht 64.0 in | Wt 134.0 lb

## 2020-10-08 DIAGNOSIS — I351 Nonrheumatic aortic (valve) insufficiency: Secondary | ICD-10-CM

## 2020-10-08 DIAGNOSIS — E7800 Pure hypercholesterolemia, unspecified: Secondary | ICD-10-CM

## 2020-10-08 DIAGNOSIS — D649 Anemia, unspecified: Secondary | ICD-10-CM

## 2020-10-08 DIAGNOSIS — I10 Essential (primary) hypertension: Secondary | ICD-10-CM

## 2020-10-08 NOTE — Progress Notes (Signed)
Subjective:    Patient ID: Julia Robinson, female    DOB: 1940/06/16, 80 y.o.   MRN: [id number]  8/21 Patient is a very pleasant 80 year old Caucasian female here today for regular checkup. She is due for mammogram. She refuses a mammogram. She is due for a bone density test. She refuses to take a bone density test. She is taking calcium and vitamin D however. She is long overdue for colonoscopy. She declines a colonoscopy. I reviewed her immunizations. She is due for the shingles vaccine along with the Covid vaccine. She declines them both. I spent more than 10 minutes with both she and her husband trying to convince them to get the Covid shot. I answered all their questions as best I could however she still declines getting the Covid vaccination. She denies any falls, depression, or memory loss.  At that time, my plan was: Echocardiogram last year revealed mild to moderate aortic regurgitation. Patient denies any syncope or near syncope or chest pain or shortness of breath with activity. Therefore it is asymptomatic. Murmur has not changed perceptibly since last year. I recommended a mammogram, colonoscopy, and bone density test. The patient declined all three. Her lab work is significant for a drop in her hemoglobin. Therefore I will check a ferritin level as well as a vitamin B12 level. Also check her stool for blood. Blood pressure today is excellent. Cholesterol is excellent. Strongly, strongly recommended COVID-19 vaccination however the patient declined. Also recommended the shingles vaccine. Patient denies any falls, depression, or memory loss 06/10/20 Patient is a very sweet 80 year old Caucasian female who presents today for follow-up.  On her echocardiogram in 2020 she was found to have moderate aortic regurgitation with mild aortic stenosis.  She denies any syncope or near syncope.  She denies any chest pain.  She denies any shortness of breath with activity.  She denies any paroxysmal  nocturnal dyspnea or orthopnea.  On her exam today there is no pitting edema in extremities.  The murmur has not intensified.  Overall she is doing well.  Her blood pressure is excellent at 130/66.  She is due to recheck fasting lab work. 10/08/20 She is here today for follow-up.  She has a history of aortic valve insufficiency however she denies any shortness of breath.  She denies any dyspnea on exertion.  She denies any orthopnea.  She denies any syncope or near syncope.  She denies any lightheadedness.  Today on exam, her murmur is still a 2/6 heard best over the aortic valve.  There is no leg swelling.  She appears euvolemic.  Blood pressure today is outstanding at 130/72.  She is still taking iron for anemia.  In March her hemoglobin was less than 11.  We are due to recheck that today Past Medical History:  Diagnosis Date   Diverticulosis    High cholesterol    Hyperlipidemia    Hypertension    Moderate aortic regurgitation    with mild aortic stenosis echo 10/20   Osteoporosis    Prolapsed uterus    Past Surgical History:  Procedure Laterality Date   ABDOMINAL HYSTERECTOMY     A+P repair 2007   BACK SURGERY     CLOSED REDUCTION FINGER WITH PERCUTANEOUS PINNING Left 09/10/2015   Procedure: CLOSED REDUCTION FINGER WITH PERCUTANEOUS PINNING;  Surgeon: Betha Loa, MD;  Location: [facility] OR;  Service: Orthopedics;  Laterality: Left;   I & D EXTREMITY Left 09/10/2015   Procedure: IRRIGATION AND DEBRIDEMENT THUMB, REPAIR  OF SKIN AND NAIL BED;  Surgeon: Betha Loa, MD;  Location: [facility] OR;  Service: Orthopedics;  Laterality: Left;   Current Outpatient Medications on File Prior to Visit  Medication Sig Dispense Refill   aspirin 81 MG tablet Take 81 mg by mouth daily.     Calcium Carbonate-Vitamin D3 (CALCIUM 600-D) 600-400 MG-UNIT TABS Take by mouth.     Cyanocobalamin 1000 MCG CAPS Take 1 capsule by mouth daily.     doxazosin (CARDURA) 2 MG tablet TAKE 1 TABLET EVERY DAY (REQUIRES OFFICE VISIT  BEFORE ANY FURTHER REFILLS CAN BE GIVEN) 90 tablet 0   ferrous sulfate (FERROUSUL) 325 (65 FE) MG tablet Take 1 tablet (325 mg total) by mouth daily with breakfast.  3   furosemide (LASIX) 20 MG tablet Take 20 mg by mouth daily as needed for fluid.      lovastatin (MEVACOR) 20 MG tablet TAKE 1 TABLET AT BEDTIME (NEED MD APPOINTMENT FOR REFILLS) 90 tablet 1   Magnesium 400 MG CAPS Take 1 tablet by mouth daily.      Magnesium Oxide 400 MG CAPS Take by mouth.     piroxicam (FELDENE) 20 MG capsule TAKE 1 CAPSULE BY MOUTH ONCE DAILY AS NEEDED FOR PAIN 30 capsule 3   Potassium Gluconate 595 MG CAPS Take 1 capsule by mouth daily.      No current facility-administered medications on file prior to visit.   Allergies  Allergen Reactions   Ace Inhibitors     Cough   Amlodipine     Swelling   Codeine     unknown   Crestor [Rosuvastatin]     unknown   Dramamine [Dimenhydrinate]     unknown   Lipitor [Atorvastatin]     Constipation   Quinine Derivatives     Hives   Sulfa Antibiotics     unknown   Social History   Socioeconomic History   Marital status: Married    Spouse name: Not on file   Number of children: Not on file   Years of education: Not on file   Highest education level: Not on file  Occupational History   Not on file  Tobacco Use   Smoking status: Never   Smokeless tobacco: Former    Types: Chew  Substance and Sexual Activity   Alcohol use: No   Drug use: No   Sexual activity: Yes  Other Topics Concern   Not on file  Social History Narrative   Not on file   Social Determinants of Health   Financial Resource Strain: Not on file  Food Insecurity: Not on file  Transportation Needs: Not on file  Physical Activity: Not on file  Stress: Not on file  Social Connections: Not on file  Intimate Partner Violence: Not on file   Family History  Problem Relation Age of Onset   Heart disease Mother    Diabetes Sister    Hypertension Sister    Hyperlipidemia Sister     Cancer Maternal Aunt        lung      Review of Systems  All other systems reviewed and are negative.     Objective:   Physical Exam Vitals reviewed.  Constitutional:      General: She is not in acute distress.    Appearance: She is well-developed. She is not diaphoretic.  HENT:     Head: Normocephalic and atraumatic.     Right Ear: External ear normal.     Left Ear: External  ear normal.     Nose: Nose normal.     Mouth/Throat:     Pharynx: No oropharyngeal exudate.  Eyes:     General: No scleral icterus.       Right eye: No discharge.        Left eye: No discharge.     Conjunctiva/sclera: Conjunctivae normal.     Pupils: Pupils are equal, round, and reactive to light.  Neck:     Thyroid: No thyromegaly.     Vascular: No JVD.     Trachea: No tracheal deviation.  Cardiovascular:     Rate and Rhythm: Normal rate and regular rhythm.     Heart sounds: Murmur heard.    No friction rub. No gallop.  Pulmonary:     Effort: Pulmonary effort is normal. No respiratory distress.     Breath sounds: Normal breath sounds. No stridor. No wheezing or rales.  Chest:     Chest wall: No tenderness.  Abdominal:     General: Bowel sounds are normal. There is no distension.     Palpations: Abdomen is soft. There is no mass.     Tenderness: There is no abdominal tenderness. There is no guarding or rebound.  Musculoskeletal:        General: No tenderness. Normal range of motion.     Cervical back: Normal range of motion and neck supple.  Lymphadenopathy:     Cervical: No cervical adenopathy.  Skin:    General: Skin is warm.     Coloration: Skin is not pale.     Findings: No erythema or rash.  Neurological:     Mental Status: She is alert and oriented to person, place, and time.     Cranial Nerves: No cranial nerve deficit.     Motor: No abnormal muscle tone.     Coordination: Coordination normal.     Deep Tendon Reflexes: Reflexes are normal and symmetric.  Psychiatric:         Behavior: Behavior normal.        Thought Content: Thought content normal.        Judgment: Judgment normal.          Assessment & Plan:  Benign essential HTN - Plan: CBC with Differential/Platelet, COMPLETE METABOLIC PANEL WITH GFR, Lipid panel  Pure hypercholesterolemia - Plan: CBC with Differential/Platelet, COMPLETE METABOLIC PANEL WITH GFR, Lipid panel  Nonrheumatic aortic valve insufficiency  Anemia, unspecified type Blood pressure today is well controlled.  Check CBC CMP and fasting lipid panel.  Ideally I like to keep her LDL cholesterol below 100.  Clinically her aortic valve insufficiency is stable.  I recommended an echocardiogram to monitor this however the patient would like to defer that at the present time and states that she would like to wait till her next visit to schedule the echocardiogram.  Regarding her anemia, she is due to recheck a CBC.  If her hemoglobin has improved we will need to discontinue iron

## 2020-10-09 ENCOUNTER — Other Ambulatory Visit: Payer: Self-pay | Admitting: *Deleted

## 2020-10-09 LAB — CBC WITH DIFFERENTIAL/PLATELET
Absolute Monocytes: 464 cells/uL (ref 200–950)
Basophils Absolute: 41 cells/uL (ref 0–200)
Basophils Relative: 0.8 %
Eosinophils Absolute: 184 cells/uL (ref 15–500)
Eosinophils Relative: 3.6 %
HCT: 34.6 % — ABNORMAL LOW (ref 35.0–45.0)
Hemoglobin: 11.3 g/dL — ABNORMAL LOW (ref 11.7–15.5)
Lymphs Abs: 1183 cells/uL (ref 850–3900)
MCH: 29.4 pg (ref 27.0–33.0)
MCHC: 32.7 g/dL (ref 32.0–36.0)
MCV: 90.1 fL (ref 80.0–100.0)
MPV: 9.5 fL (ref 7.5–12.5)
Monocytes Relative: 9.1 %
Neutro Abs: 3228 cells/uL (ref 1500–7800)
Neutrophils Relative %: 63.3 %
Platelets: 315 10*3/uL (ref 140–400)
RBC: 3.84 10*6/uL (ref 3.80–5.10)
RDW: 13.6 % (ref 11.0–15.0)
Total Lymphocyte: 23.2 %
WBC: 5.1 10*3/uL (ref 3.8–10.8)

## 2020-10-09 LAB — COMPLETE METABOLIC PANEL WITH GFR
AG Ratio: 1.8 (calc) (ref 1.0–2.5)
ALT: 14 U/L (ref 6–29)
AST: 18 U/L (ref 10–35)
Albumin: 4.5 g/dL (ref 3.6–5.1)
Alkaline phosphatase (APISO): 58 U/L (ref 37–153)
BUN/Creatinine Ratio: 15 (calc) (ref 6–22)
BUN: 16 mg/dL (ref 7–25)
CO2: 27 mmol/L (ref 20–32)
Calcium: 10.2 mg/dL (ref 8.6–10.4)
Chloride: 105 mmol/L (ref 98–110)
Creat: 1.09 mg/dL — ABNORMAL HIGH (ref 0.60–1.00)
Globulin: 2.5 g/dL (calc) (ref 1.9–3.7)
Glucose, Bld: 92 mg/dL (ref 65–99)
Potassium: 4 mmol/L (ref 3.5–5.3)
Sodium: 141 mmol/L (ref 135–146)
Total Bilirubin: 0.5 mg/dL (ref 0.2–1.2)
Total Protein: 7 g/dL (ref 6.1–8.1)
eGFR: 52 mL/min/{1.73_m2} — ABNORMAL LOW (ref >=60)

## 2020-10-09 LAB — LIPID PANEL
Cholesterol: 165 mg/dL (ref <200)
HDL: 61 mg/dL (ref >=50)
LDL Cholesterol (Calc): 83 mg/dL (calc)
Non-HDL Cholesterol (Calc): 104 mg/dL (calc) (ref <130)
Total CHOL/HDL Ratio: 2.7 (calc) (ref <5.0)
Triglycerides: 110 mg/dL (ref <150)

## 2020-10-19 ENCOUNTER — Telehealth: Payer: Self-pay

## 2020-10-19 ENCOUNTER — Other Ambulatory Visit: Payer: Self-pay | Admitting: Family Medicine

## 2020-10-19 DIAGNOSIS — I351 Nonrheumatic aortic (valve) insufficiency: Secondary | ICD-10-CM

## 2020-10-19 NOTE — Telephone Encounter (Signed)
Pt called in stating that she needed to get a referral for a cardiologist appt to get her valves checked. Please advise  Cb#: [phone]

## 2020-10-19 NOTE — Telephone Encounter (Signed)
Please advise 

## 2020-10-21 NOTE — Telephone Encounter (Signed)
Call placed to patient and patient made aware.  

## 2020-10-22 ENCOUNTER — Ambulatory Visit (HOSPITAL_COMMUNITY): Payer: Medicare HMO | Attending: Cardiology

## 2020-10-22 ENCOUNTER — Other Ambulatory Visit: Payer: Self-pay

## 2020-10-22 DIAGNOSIS — I351 Nonrheumatic aortic (valve) insufficiency: Secondary | ICD-10-CM | POA: Diagnosis not present

## 2020-10-22 LAB — ECHOCARDIOGRAM COMPLETE
Area-P 1/2: 2.14 cm2
P 1/2 time: 693 msec
S' Lateral: 2.1 cm

## 2020-12-11 ENCOUNTER — Other Ambulatory Visit: Payer: Self-pay | Admitting: Family Medicine

## 2020-12-25 ENCOUNTER — Ambulatory Visit: Payer: Medicare HMO

## 2020-12-25 DIAGNOSIS — Z0000 Encounter for general adult medical examination without abnormal findings: Secondary | ICD-10-CM

## 2020-12-25 NOTE — Progress Notes (Signed)
Subjective:   Julia Robinson is a 80 y.o. female who presents for Medicare Annual (Subsequent) preventive examination. I connected with  Everardo Beals on 12/25/20 by a telephonic enabled telemedicine application and verified that I am speaking with the correct person using two identifiers.   I discussed the limitations of evaluation and management by telemedicine. The patient expressed understanding and agreed to proceed.  Review of Systems    Not done due to telephonic visit.        Objective:    There were no vitals filed for this visit. There is no height or weight on file to calculate BMI.  Advanced Directives 11/14/2019 09/10/2015  Does Patient Have a Medical Advance Directive? Yes Yes  Type of Estate agent of [place];Living will Healthcare Power of [place];Living will    Current Medications (verified) Outpatient Encounter Medications as of 12/25/2020  Medication Sig   aspirin 81 MG tablet Take 81 mg by mouth daily.   Calcium Carbonate-Vitamin D3 600-400 MG-UNIT TABS Take by mouth.   Cyanocobalamin 1000 MCG CAPS Take 1 capsule by mouth daily.   doxazosin (CARDURA) 2 MG tablet TAKE 1 TABLET EVERY DAY (REQUIRES OFFICE VISIT BEFORE ANY FURTHER REFILLS CAN BE GIVEN)   furosemide (LASIX) 20 MG tablet Take 20 mg by mouth daily as needed for fluid.    lovastatin (MEVACOR) 20 MG tablet TAKE 1 TABLET AT BEDTIME (NEED MD APPOINTMENT FOR REFILLS)   Magnesium 400 MG CAPS Take 1 tablet by mouth daily.    piroxicam (FELDENE) 20 MG capsule TAKE 1 CAPSULE BY MOUTH ONCE DAILY AS NEEDED FOR PAIN   Potassium Gluconate 595 MG CAPS Take 1 capsule by mouth daily.    No facility-administered encounter medications on file as of 12/25/2020.    Allergies (verified) Ace inhibitors, Amlodipine, Codeine, Crestor [rosuvastatin], Dramamine [dimenhydrinate], Lipitor [atorvastatin], Quinine derivatives, and Sulfa antibiotics   History: Past Medical History:  Diagnosis Date    Diverticulosis    High cholesterol    Hyperlipidemia    Hypertension    Moderate aortic regurgitation    with mild aortic stenosis echo 10/20   Osteoporosis    Prolapsed uterus    Past Surgical History:  Procedure Laterality Date   ABDOMINAL HYSTERECTOMY     A+P repair 2007   BACK SURGERY     CLOSED REDUCTION FINGER WITH PERCUTANEOUS PINNING Left 09/10/2015   Procedure: CLOSED REDUCTION FINGER WITH PERCUTANEOUS PINNING;  Surgeon: Betha Loa, MD;  Location: [facility] OR;  Service: Orthopedics;  Laterality: Left;   I & D EXTREMITY Left 09/10/2015   Procedure: IRRIGATION AND DEBRIDEMENT THUMB, REPAIR OF SKIN AND NAIL BED;  Surgeon: Betha Loa, MD;  Location: [facility] OR;  Service: Orthopedics;  Laterality: Left;   Family History  Problem Relation Age of Onset   Heart disease Mother    Diabetes Sister    Hypertension Sister    Hyperlipidemia Sister    Cancer Maternal Aunt        lung   Social History   Socioeconomic History   Marital status: Married    Spouse name: Not on file   Number of children: Not on file   Years of education: Not on file   Highest education level: Not on file  Occupational History   Not on file  Tobacco Use   Smoking status: Never   Smokeless tobacco: Former    Types: Chew  Substance and Sexual Activity   Alcohol use: No   Drug use: No  Sexual activity: Yes  Other Topics Concern   Not on file  Social History Narrative   Not on file   Social Determinants of Health   Financial Resource Strain: Not on file  Food Insecurity: Not on file  Transportation Needs: Not on file  Physical Activity: Not on file  Stress: Not on file  Social Connections: Not on file    Tobacco Counseling Counseling given: Not Answered   Clinical Intake:    Diabetic?no    Activities of Daily Living No flowsheet data found.  Patient Care Team: [name], MD as PCP - General (Family Medicine) West Bali, MD (Inactive) as Consulting Physician  (Gastroenterology) Erroll Luna, [facility] as Pharmacist (Pharmacist)  Indicate any recent Medical Services you may have received from other than [facility] providers in the past year (date may be approximate).     Assessment:   This is a routine wellness examination for Julia Robinson.  Hearing/Vision screen No results found.  Dietary issues and exercise activities discussed:     Goals Addressed   None    Depression Screen PHQ 2/9 Scores 10/08/2020 06/10/2020 11/14/2019 08/25/2017  PHQ - 2 Score 0 0 0 0    Fall Risk Fall Risk  10/08/2020 06/10/2020 11/14/2019 01/18/2019 08/25/2017  Falls in the past year? 0 1 1 1  No  Number falls in past yr: 0 0 0 0 -  Injury with Fall? 0 0 0 1 -  Risk for fall due to : No Fall Risks History of fall(s);No Fall Risks - - -  Follow up Falls evaluation completed Falls evaluation completed - Falls evaluation completed -    FALL RISK PREVENTION PERTAINING TO THE HOME:  Any stairs in or around the home? Yes  If so, are there any without handrails? Yes  Home free of loose throw rugs in walkways, pet beds, electrical cords, etc? No  Adequate lighting in your home to reduce risk of falls? Yes   ASSISTIVE DEVICES UTILIZED TO PREVENT FALLS:  Life alert? No  Use of a cane, walker or w/c? No  Grab bars in the bathroom? No  Shower chair or bench in shower? No  Elevated toilet seat or a handicapped toilet? No   TIMED UP AND GO:  Was the test performed? No .  Not performed due to telephonic visit.   Cognitive Function:  Patient able to repeat back five word pharse. Patient cognitive of date and time.    Immunizations Immunization History  Administered Date(s) Administered   Fluad Quad(high Dose 65+) 12/25/2018   Influenza, High Dose Seasonal PF 01/18/2018   Influenza,inj,Quad PF,6+ Mos 01/08/2013, 12/25/2013, 12/16/2014, 12/17/2015, 12/27/2016   Pneumococcal Conjugate-13 09/18/2014   Pneumococcal Polysaccharide-23 12/28/2007, 12/25/2013   Td 12/27/1998    Tdap 09/10/2015    TDAP status: Up to date  Flu Vaccine status: Due, Education has been provided regarding the importance of this vaccine. Advised may receive this vaccine at local pharmacy or Health Dept. Aware to provide a copy of the vaccination record if obtained from local pharmacy or Health Dept. Verbalized acceptance and understanding.  Pneumococcal vaccine status: Up to date  Covid-19 vaccine status: Declined, Education has been provided regarding the importance of this vaccine but patient still declined. Advised may receive this vaccine at local pharmacy or Health Dept.or vaccine clinic. Aware to provide a copy of the vaccination record if obtained from local pharmacy or Health Dept. Verbalized acceptance and understanding.  Qualifies for Shingles Vaccine? Yes Zostavax completed No  Shingrix Completed?: No.    Education has been provided regarding the importance of this vaccine. Patient has been advised to call insurance company to determine out of pocket expense if they have not yet received this vaccine. Advised may also receive vaccine at local pharmacy or Health Dept. Verbalized acceptance and understanding.  Screening Tests Health Maintenance  Topic Date Due   Hepatitis C Screening  Never done   Zoster Vaccines- Shingrix (1 of 2) Never done   DEXA SCAN  Never done   INFLUENZA VACCINE  10/26/2020   TETANUS/TDAP  09/09/2025   HPV VACCINES  Aged Out   COVID-19 Vaccine  Discontinued    Health Maintenance  Health Maintenance Due  Topic Date Due   Hepatitis C Screening  Never done   Zoster Vaccines- Shingrix (1 of 2) Never done   DEXA SCAN  Never done   INFLUENZA VACCINE  10/26/2020    Colorectal cancer screening: No longer required.   Patient denied Mammogram - patient states she will come to doctor if she feels a knot.   Patient states she no longer does bone density screening. Patient states she does have osteoporosis but doesn't treat it with medication- patient  walks with weights every day for an hour  Lung Cancer Screening: (Low Dose CT Chest recommended if Age 52-80 years, 30 pack-year currently smoking OR have quit w/in 15years.) does not qualify.   Lung Cancer Screening Referral: no  Additional Screening:  Hepatitis C Screening: does not qualify; Completed   Vision Screening: Recommended annual ophthalmology exams for early detection of glaucoma and other disorders of the eye. Is the patient up to date with their annual eye exam?  Yes  Who is the provider or what is the name of the office in which the patient attends annual eye exams? Dr. Dory Horn Ginette [facility] [place]) If pt is not established with a provider, would they like to be referred to a provider to establish care? NA Dental Screening: Recommended annual dental exams for proper oral hygiene Patient would like to establish care with her son's dentist.   Community Resource Referral / Chronic Care Management: CRR required this visit?  No   CCM required this visit?  No      Plan:     I have personally reviewed and noted the following in the patient's chart:   Medical and social history Use of alcohol, tobacco or illicit drugs  Current medications and supplements including opioid prescriptions.  Functional ability and status Nutritional status Physical activity Advanced directives List of other physicians Hospitalizations, surgeries, and ER visits in previous 12 months Vitals Screenings to include cognitive, depression, and falls Referrals and appointments  In addition, I have reviewed and discussed with patient certain preventive protocols, quality metrics, and best practice recommendations. A written personalized care plan for preventive services as well as general preventive health recommendations were provided to patient.     Armandina Stammer, RN   12/25/2020   Nurse Notes: Non Face to face 31 minutes  Patient at home for entire length of visit. Nurse in office for entire  length of visit.     Ms. Coderre , Thank you for taking time to come for your Medicare Wellness Visit. I appreciate your ongoing commitment to your health goals. Please review the following plan we discussed and let me know if I can assist you in the future.   These are the goals we discussed: Continue exercise plan Consider Dexa scan. Visit dentist.    This  is a list of the screening recommended for you and due dates:  Health Maintenance  Topic Date Due   Hepatitis C Screening: USPSTF Recommendation to screen - Ages 47-79 yo.  Never done   Zoster (Shingles) Vaccine (1 of 2) Never done   DEXA scan (bone density measurement)  Never done   Flu Shot  10/26/2020   Tetanus Vaccine  09/09/2025   HPV Vaccine  Aged Out   COVID-19 Vaccine  Discontinued

## 2021-01-21 ENCOUNTER — Encounter: Payer: Self-pay | Admitting: Family Medicine

## 2021-01-21 ENCOUNTER — Ambulatory Visit (INDEPENDENT_AMBULATORY_CARE_PROVIDER_SITE_OTHER): Payer: Medicare HMO | Admitting: Family Medicine

## 2021-01-21 ENCOUNTER — Other Ambulatory Visit: Payer: Self-pay

## 2021-01-21 VITALS — BP 138/74 | HR 66 | Temp 97.9°F | Resp 14 | Ht 64.0 in | Wt 133.0 lb

## 2021-01-21 DIAGNOSIS — D649 Anemia, unspecified: Secondary | ICD-10-CM

## 2021-01-21 DIAGNOSIS — I10 Essential (primary) hypertension: Secondary | ICD-10-CM | POA: Diagnosis not present

## 2021-01-21 DIAGNOSIS — I351 Nonrheumatic aortic (valve) insufficiency: Secondary | ICD-10-CM | POA: Diagnosis not present

## 2021-01-21 DIAGNOSIS — E7800 Pure hypercholesterolemia, unspecified: Secondary | ICD-10-CM | POA: Diagnosis not present

## 2021-01-21 DIAGNOSIS — Z780 Asymptomatic menopausal state: Secondary | ICD-10-CM | POA: Diagnosis not present

## 2021-01-21 NOTE — Progress Notes (Signed)
Subjective:    Patient ID: Julia Robinson, female    DOB: 01/29/1941, 80 y.o.   MRN: [id number]  She is here today for follow-up.  She has a history of HTN, HLD, and aortic valve insufficiency however she denies any shortness of breath.  She denies any dyspnea on exertion.  She denies any orthopnea.  She denies any syncope or near syncope.  She denies any lightheadedness. Had repeat echo in 7/22 which showed diastolic dysfunction but otherwise looked excellent with no significant valve damage.  Her blood pressures well controlled today at 138/74.  Overall she is been doing well.  Her lab work in July was excellent except for some mild chronic kidney disease.  She does have a history of osteoporosis and is not on any type of bisphosphonate because she refused to take it.  She is not had a bone density test in quite some time Past Medical History:  Diagnosis Date   Diverticulosis    High cholesterol    Hyperlipidemia    Hypertension    Moderate aortic regurgitation    with mild aortic stenosis echo 10/20   Osteoporosis    Prolapsed uterus    Past Surgical History:  Procedure Laterality Date   ABDOMINAL HYSTERECTOMY     A+P repair 2007   BACK SURGERY     CLOSED REDUCTION FINGER WITH PERCUTANEOUS PINNING Left 09/10/2015   Procedure: CLOSED REDUCTION FINGER WITH PERCUTANEOUS PINNING;  Surgeon: Betha Loa, MD;  Location: [facility];  Service: Orthopedics;  Laterality: Left;   I & D EXTREMITY Left 09/10/2015   Procedure: IRRIGATION AND DEBRIDEMENT THUMB, REPAIR OF SKIN AND NAIL BED;  Surgeon: Betha Loa, MD;  Location: [facility];  Service: Orthopedics;  Laterality: Left;   Current Outpatient Medications on File Prior to Visit  Medication Sig Dispense Refill   aspirin 81 MG tablet Take 81 mg by mouth daily.     Calcium Carbonate-Vitamin D3 600-400 MG-UNIT TABS Take by mouth.     Cyanocobalamin 1000 MCG CAPS Take 1 capsule by mouth daily.     doxazosin (CARDURA) 2 MG tablet TAKE 1 TABLET EVERY DAY  (REQUIRES OFFICE VISIT BEFORE ANY FURTHER REFILLS CAN BE GIVEN) 90 tablet 0   furosemide (LASIX) 20 MG tablet Take 20 mg by mouth daily as needed for fluid.      lovastatin (MEVACOR) 20 MG tablet TAKE 1 TABLET AT BEDTIME (NEED MD APPOINTMENT FOR REFILLS) 90 tablet 1   Magnesium 400 MG CAPS Take 1 tablet by mouth daily.      piroxicam (FELDENE) 20 MG capsule TAKE 1 CAPSULE BY MOUTH ONCE DAILY AS NEEDED FOR PAIN 30 capsule 3   Potassium Gluconate 595 MG CAPS Take 1 capsule by mouth daily.      No current facility-administered medications on file prior to visit.   Allergies  Allergen Reactions   Ace Inhibitors     Cough   Amlodipine     Swelling   Codeine     unknown   Crestor [Rosuvastatin]     unknown   Dramamine [Dimenhydrinate]     unknown   Lipitor [Atorvastatin]     Constipation   Quinine Derivatives     Hives   Sulfa Antibiotics     unknown   Social History   Socioeconomic History   Marital status: Married    Spouse name: Not on file   Number of children: Not on file   Years of education: Not on file   Highest  education level: Not on file  Occupational History   Not on file  Tobacco Use   Smoking status: Never   Smokeless tobacco: Former    Types: Chew  Substance and Sexual Activity   Alcohol use: No   Drug use: No   Sexual activity: Yes  Other Topics Concern   Not on file  Social History Narrative   Not on file   Social Determinants of Health   Financial Resource Strain: Not on file  Food Insecurity: Not on file  Transportation Needs: Not on file  Physical Activity: Not on file  Stress: Not on file  Social Connections: Not on file  Intimate Partner Violence: Not on file   Family History  Problem Relation Age of Onset   Heart disease Mother    Diabetes Sister    Hypertension Sister    Hyperlipidemia Sister    Cancer Maternal Aunt        lung      Review of Systems  All other systems reviewed and are negative.     Objective:   Physical  Exam Vitals reviewed.  Constitutional:      General: She is not in acute distress.    Appearance: She is well-developed. She is not diaphoretic.  HENT:     Head: Normocephalic and atraumatic.     Right Ear: External ear normal.     Left Ear: External ear normal.     Nose: Nose normal.     Mouth/Throat:     Pharynx: No oropharyngeal exudate.  Eyes:     General: No scleral icterus.       Right eye: No discharge.        Left eye: No discharge.     Conjunctiva/sclera: Conjunctivae normal.     Pupils: Pupils are equal, round, and reactive to light.  Neck:     Thyroid: No thyromegaly.     Vascular: No JVD.     Trachea: No tracheal deviation.  Cardiovascular:     Rate and Rhythm: Normal rate and regular rhythm.     Heart sounds: Murmur heard.    No friction rub. No gallop.  Pulmonary:     Effort: Pulmonary effort is normal. No respiratory distress.     Breath sounds: Normal breath sounds. No stridor. No wheezing or rales.  Chest:     Chest wall: No tenderness.  Abdominal:     General: Bowel sounds are normal. There is no distension.     Palpations: Abdomen is soft. There is no mass.     Tenderness: There is no abdominal tenderness. There is no guarding or rebound.  Musculoskeletal:        General: No tenderness. Normal range of motion.     Cervical back: Normal range of motion and neck supple.  Lymphadenopathy:     Cervical: No cervical adenopathy.  Skin:    General: Skin is warm.     Coloration: Skin is not pale.     Findings: No erythema or rash.  Neurological:     Mental Status: She is alert and oriented to person, place, and time.     Cranial Nerves: No cranial nerve deficit.     Motor: No abnormal muscle tone.     Coordination: Coordination normal.     Deep Tendon Reflexes: Reflexes are normal and symmetric.  Psychiatric:        Behavior: Behavior normal.        Thought Content: Thought content normal.  Judgment: Judgment normal.          Assessment &  Plan:  Nonrheumatic aortic valve insufficiency  Benign essential HTN  Pure hypercholesterolemia  Anemia, unspecified type  Postmenopausal estrogen deficiency - Plan: DG Bone Density Blood pressure today is excellent.  Lab work from July was outstanding.  Caution the patient against NSAIDs due to chronic kidney disease.  Anemia is stable.  I will schedule the patient for a bone density test.  If she has osteoporosis, I have encouraged her to take Fosamax.  Await the results of the bone density test

## 2021-01-26 ENCOUNTER — Other Ambulatory Visit: Payer: Self-pay

## 2021-01-26 ENCOUNTER — Ambulatory Visit (HOSPITAL_COMMUNITY)
Admission: RE | Admit: 2021-01-26 | Discharge: 2021-01-26 | Disposition: A | Discharge status: Home / Self Care | Payer: Medicare HMO | Source: Ambulatory Visit | Attending: Family Medicine | Admitting: Family Medicine

## 2021-01-26 DIAGNOSIS — Z780 Asymptomatic menopausal state: Secondary | ICD-10-CM | POA: Diagnosis not present

## 2021-01-26 DIAGNOSIS — M810 Age-related osteoporosis without current pathological fracture: Secondary | ICD-10-CM | POA: Diagnosis not present

## 2021-02-03 ENCOUNTER — Other Ambulatory Visit: Payer: Self-pay | Admitting: *Deleted

## 2021-02-03 MED ORDER — ALENDRONATE SODIUM 70 MG PO TABS: 70.0000 mg | ORAL_TABLET | ORAL | 3 refills | Status: DC

## 2021-02-22 ENCOUNTER — Other Ambulatory Visit: Payer: Self-pay | Admitting: Family Medicine

## 2021-04-09 ENCOUNTER — Encounter: Payer: Self-pay | Admitting: Family Medicine

## 2021-04-09 ENCOUNTER — Other Ambulatory Visit: Payer: Self-pay

## 2021-04-09 ENCOUNTER — Ambulatory Visit (INDEPENDENT_AMBULATORY_CARE_PROVIDER_SITE_OTHER): Payer: Medicare HMO | Admitting: Family Medicine

## 2021-04-09 VITALS — BP 140/80 | HR 72 | Temp 98.1°F | Resp 16 | Ht 64.0 in | Wt 124.0 lb

## 2021-04-09 DIAGNOSIS — U071 COVID-19: Secondary | ICD-10-CM | POA: Diagnosis not present

## 2021-04-09 MED ORDER — PREDNISONE 20 MG PO TABS: ORAL_TABLET | ORAL | 0 refills | Status: DC

## 2021-04-09 NOTE — Progress Notes (Signed)
Subjective:    Patient ID: Julia Robinson, female    DOB: [birth year], 81 y.o.   MRN: [id number]  HPI Patient's husband recently was diagnosed with COVID-pneumonia.  Patient developed similar symptoms at the exact same time as her husband.  This occurred right after Christmas around December 30.  She never tested for COVID but her husband tested positive.  She continues to report coughing and wheezing and chest congestion.  She is now more than 14 days from symptom onset.  She denies any fever or chills.  She denies any pleurisy or chest pain.  On examination she does have faint rhonchorous breath sounds in the lower lobes bilaterally and also faint expiratory wheezing.  Otherwise she is doing well.  She is eating and drinking and maintaining her hydration. Past Medical History:  Diagnosis Date   Diverticulosis    High cholesterol    Hyperlipidemia    Hypertension    Moderate aortic regurgitation    with mild aortic stenosis echo 10/20   Osteoporosis    Prolapsed uterus    Past Surgical History:  Procedure Laterality Date   ABDOMINAL HYSTERECTOMY     A+P repair 2007   BACK SURGERY     CLOSED REDUCTION FINGER WITH PERCUTANEOUS PINNING Left 09/10/2015   Procedure: CLOSED REDUCTION FINGER WITH PERCUTANEOUS PINNING;  Surgeon: Betha Loa, MD;  Location: [facility];  Service: Orthopedics;  Laterality: Left;   I & D EXTREMITY Left 09/10/2015   Procedure: IRRIGATION AND DEBRIDEMENT THUMB, REPAIR OF SKIN AND NAIL BED;  Surgeon: Betha Loa, MD;  Location: [facility];  Service: Orthopedics;  Laterality: Left;   Current Outpatient Medications on File Prior to Visit  Medication Sig Dispense Refill   alendronate (FOSAMAX) 70 MG tablet Take 1 tablet (70 mg total) by mouth every 7 (seven) days. Take with a full glass of water on an empty stomach. 12 tablet 3   aspirin 81 MG tablet Take 81 mg by mouth daily.     Calcium Carbonate-Vitamin D3 600-400 MG-UNIT TABS Take by mouth.     Cyanocobalamin 1000 MCG CAPS  Take 1 capsule by mouth daily.     doxazosin (CARDURA) 2 MG tablet TAKE 1 TABLET EVERY DAY (REQUIRES OFFICE VISIT BEFORE ANY FURTHER REFILLS CAN BE GIVEN) 90 tablet 0   furosemide (LASIX) 20 MG tablet Take 20 mg by mouth daily as needed for fluid.      lovastatin (MEVACOR) 20 MG tablet TAKE 1 TABLET AT BEDTIME (NEED MD APPOINTMENT FOR REFILLS) 90 tablet 3   Magnesium 400 MG CAPS Take 1 tablet by mouth daily.      piroxicam (FELDENE) 20 MG capsule TAKE 1 CAPSULE BY MOUTH ONCE DAILY AS NEEDED FOR PAIN 30 capsule 3   Potassium Gluconate 595 MG CAPS Take 1 capsule by mouth daily.      No current facility-administered medications on file prior to visit.   Allergies  Allergen Reactions   Ace Inhibitors     Cough   Amlodipine     Swelling   Codeine     unknown   Crestor [Rosuvastatin]     unknown   Dramamine [Dimenhydrinate]     unknown   Lipitor [Atorvastatin]     Constipation   Quinine Derivatives     Hives   Sulfa Antibiotics     unknown    Social History   Socioeconomic History   Marital status: Married    Spouse name: Not on file   Number of children:  Not on file   Years of education: Not on file   Highest education level: Not on file  Occupational History   Not on file  Tobacco Use   Smoking status: Never   Smokeless tobacco: Former    Types: Chew  Substance and Sexual Activity   Alcohol use: No   Drug use: No   Sexual activity: Yes  Other Topics Concern   Not on file  Social History Narrative   Not on file   Social Determinants of Health   Financial Resource Strain: Not on file  Food Insecurity: Not on file  Transportation Needs: Not on file  Physical Activity: Not on file  Stress: Not on file  Social Connections: Not on file  Intimate Partner Violence: Not on file      Review of Systems  All other systems reviewed and are negative.     Objective:   Physical Exam Constitutional:      General: She is not in acute distress.    Appearance: Normal  appearance. She is normal weight. She is not ill-appearing, toxic-appearing or diaphoretic.  HENT:     Right Ear: Tympanic membrane and ear canal normal.     Left Ear: Tympanic membrane and ear canal normal.  Cardiovascular:     Rate and Rhythm: Normal rate and regular rhythm.     Heart sounds: Normal heart sounds. No murmur heard.   No gallop.  Pulmonary:     Effort: Pulmonary effort is normal. No respiratory distress.     Breath sounds: No stridor. Wheezing and rhonchi present.  Chest:     Chest wall: No tenderness.  Abdominal:     General: Abdomen is flat. Bowel sounds are normal.  Musculoskeletal:     Cervical back: Neck supple.  Neurological:     Mental Status: She is alert.          Assessment & Plan:   COVID I presume that the patient had COVID as her husband did.  However she is long outside the 5-day window of symptom onset where 1 could consider paxlovid.  Clinically she is improving.  I will start a prednisone taper pack for the wheezing.  Use Mucinex for chest congestion.  She is afebrile.  She denies any sinus pain or rhinorrhea or sore throat or nausea or vomiting.  She is maintaining her hydration.  Reassess next week if no better or sooner if worse.

## 2021-04-13 DIAGNOSIS — H40013 Open angle with borderline findings, low risk, bilateral: Secondary | ICD-10-CM | POA: Diagnosis not present

## 2021-04-13 DIAGNOSIS — H43393 Other vitreous opacities, bilateral: Secondary | ICD-10-CM | POA: Diagnosis not present

## 2021-04-13 DIAGNOSIS — H26492 Other secondary cataract, left eye: Secondary | ICD-10-CM | POA: Diagnosis not present

## 2021-04-13 DIAGNOSIS — H0102A Squamous blepharitis right eye, upper and lower eyelids: Secondary | ICD-10-CM | POA: Diagnosis not present

## 2021-04-13 DIAGNOSIS — H538 Other visual disturbances: Secondary | ICD-10-CM | POA: Diagnosis not present

## 2021-04-13 DIAGNOSIS — H0102B Squamous blepharitis left eye, upper and lower eyelids: Secondary | ICD-10-CM | POA: Diagnosis not present

## 2021-04-13 DIAGNOSIS — H04123 Dry eye syndrome of bilateral lacrimal glands: Secondary | ICD-10-CM | POA: Diagnosis not present

## 2021-04-13 DIAGNOSIS — Z961 Presence of intraocular lens: Secondary | ICD-10-CM | POA: Diagnosis not present

## 2021-04-15 DIAGNOSIS — H524 Presbyopia: Secondary | ICD-10-CM | POA: Diagnosis not present

## 2021-04-15 DIAGNOSIS — Z0100 Encounter for examination of eyes and vision without abnormal findings: Secondary | ICD-10-CM | POA: Diagnosis not present

## 2021-04-15 DIAGNOSIS — H26492 Other secondary cataract, left eye: Secondary | ICD-10-CM | POA: Diagnosis not present

## 2021-04-26 ENCOUNTER — Other Ambulatory Visit: Payer: Self-pay | Admitting: Family Medicine

## 2021-04-29 ENCOUNTER — Ambulatory Visit: Payer: Medicare HMO

## 2021-10-21 ENCOUNTER — Other Ambulatory Visit: Payer: Self-pay | Admitting: Family Medicine

## 2021-10-22 NOTE — Telephone Encounter (Signed)
Refused Feldene 20 mg because it's being requested too soon.   Given on 09/22/2021 #30, 3 refills.

## 2021-12-17 ENCOUNTER — Ambulatory Visit (INDEPENDENT_AMBULATORY_CARE_PROVIDER_SITE_OTHER): Payer: Medicare HMO

## 2021-12-17 VITALS — Ht 64.0 in | Wt 124.0 lb

## 2021-12-17 DIAGNOSIS — Z0000 Encounter for general adult medical examination without abnormal findings: Secondary | ICD-10-CM | POA: Diagnosis not present

## 2021-12-17 NOTE — Progress Notes (Signed)
Subjective:   Julia Robinson is a 81 y.o. female who presents for Medicare Annual (Subsequent) preventive examination.SUBJECTIVE:  PHONE VISIT   Review of Systems     Cardiac Risk Factors include: advanced age (>21men, >69 women);hypertension;dyslipidemia     Objective:    Today's Vitals   12/17/21 1535  Weight: 124 lb (56.2 kg)  Height: 5\' 4"  (1.626 m)   Body mass index is 21.28 kg/m.     12/17/2021    3:57 PM 11/14/2019    9:37 AM 09/10/2015    5:54 PM  Advanced Directives  Does Patient Have a Medical Advance Directive? No Yes Yes  Type of Corporate treasurer of [place];Living will [place];Living will    Current Medications (verified) Outpatient Encounter Medications as of 12/17/2021  Medication Sig   alendronate (FOSAMAX) 70 MG tablet Take 1 tablet (70 mg total) by mouth every 7 (seven) days. Take with a full glass of water on an empty stomach.   aspirin 81 MG tablet Take 81 mg by mouth daily.   Calcium Carbonate-Vitamin D3 600-400 MG-UNIT TABS Take by mouth.   Cyanocobalamin 1000 MCG CAPS Take 1 capsule by mouth daily.   doxazosin (CARDURA) 2 MG tablet Take 1 tablet (2 mg total) by mouth daily.   lovastatin (MEVACOR) 20 MG tablet TAKE 1 TABLET AT BEDTIME (NEED MD APPOINTMENT FOR REFILLS)   Magnesium 400 MG CAPS Take 1 tablet by mouth daily.    piroxicam (FELDENE) 20 MG capsule TAKE 1 CAPSULE BY MOUTH ONCE DAILY AS NEEDED FOR PAIN   Potassium Gluconate 595 MG CAPS Take 1 capsule by mouth daily.    furosemide (LASIX) 20 MG tablet Take 20 mg by mouth daily as needed for fluid.  (Patient not taking: Reported on 12/17/2021)   predniSONE (DELTASONE) 20 MG tablet 3 tabs poqday 1-2, 2 tabs poqday 3-4, 1 tab poqday 5-6 (Patient not taking: Reported on 12/17/2021)   No facility-administered encounter medications on file as of 12/17/2021.    Allergies (verified) Ace inhibitors, Amlodipine, Codeine, Crestor [rosuvastatin], Dramamine  [dimenhydrinate], Lipitor [atorvastatin], Quinine derivatives, and Sulfa antibiotics   History: Past Medical History:  Diagnosis Date   Diverticulosis    High cholesterol    Hyperlipidemia    Hypertension    Moderate aortic regurgitation    with mild aortic stenosis echo 10/20   Osteoporosis    Prolapsed uterus    Past Surgical History:  Procedure Laterality Date   ABDOMINAL HYSTERECTOMY     A+P repair 2007   BACK SURGERY     CLOSED REDUCTION FINGER WITH PERCUTANEOUS PINNING Left 09/10/2015   Procedure: CLOSED REDUCTION FINGER WITH PERCUTANEOUS PINNING;  Surgeon: Leanora Cover, MD;  Location: [facility];  Service: Orthopedics;  Laterality: Left;   I & D EXTREMITY Left 09/10/2015   Procedure: IRRIGATION AND DEBRIDEMENT THUMB, REPAIR OF SKIN AND NAIL BED;  Surgeon: Leanora Cover, MD;  Location: [facility];  Service: Orthopedics;  Laterality: Left;   Family History  Problem Relation Age of Onset   Heart disease Mother    Diabetes Sister    Hypertension Sister    Hyperlipidemia Sister    Cancer Maternal Aunt        lung   Social History   Socioeconomic History   Marital status: Married    Spouse name: Not on file   Number of children: Not on file   Years of education: Not on file   Highest education level: Not on file  Occupational History   Not on file  Tobacco Use   Smoking status: Never   Smokeless tobacco: Former    Types: Chew  Substance and Sexual Activity   Alcohol use: No   Drug use: No   Sexual activity: Yes  Other Topics Concern   Not on file  Social History Narrative   Not on file   Social Determinants of Health   Financial Resource Strain: Low Risk  (12/17/2021)   Overall Financial Resource Strain (CARDIA)    Difficulty of Paying Living Expenses: Not hard at all  Food Insecurity: No Food Insecurity (12/17/2021)   Hunger Vital Sign    Worried About Running Out of Food in the Last Year: Never true    Ran Out of Food in the Last Year: Never true  Transportation  Needs: No Transportation Needs (12/17/2021)   PRAPARE - Administrator, Civil Service (Medical): No    Lack of Transportation (Non-Medical): No  Physical Activity: Sufficiently Active (12/17/2021)   Exercise Vital Sign    Days of Exercise per Week: 5 days    Minutes of Exercise per Session: 40 min  Stress: No Stress Concern Present (12/17/2021)   [employer] of Occupational Health - Occupational Stress Questionnaire    Feeling of Stress : Only a little  Social Connections: Moderately Integrated (12/17/2021)   Social Connection and Isolation Panel [NHANES]    Frequency of Communication with Friends and Family: Once a week    Frequency of Social Gatherings with Friends and Family: Once a week    Attends Religious Services: 1 to 4 times per year    Active Member of [employer] or Organizations: Yes    Attends Banker Meetings: 1 to 4 times per year    Marital Status: Married    Tobacco Counseling Counseling given: Not Answered   Clinical Intake:  Pre-visit preparation completed: Yes  Pain : No/denies pain     BMI - recorded: 21.2 Nutritional Status: BMI of 19-24  Normal Nutritional Risks: None Diabetes: No  How often do you need to have someone help you when you read instructions, pamphlets, or other written materials from your doctor or pharmacy?: 1 - Never  Diabetic?no  Interpreter Needed?: No      Activities of Daily Living    12/17/2021    4:01 PM  In your present state of health, do you have any difficulty performing the following activities:  Hearing? 0  Vision? 0  Difficulty concentrating or making decisions? 0  Walking or climbing stairs? 0  Dressing or bathing? 0  Doing errands, shopping? 0  Preparing Food and eating ? N  Using the Toilet? N  In the past six months, have you accidently leaked urine? N  Do you have problems with loss of bowel control? N  Managing your Medications? N    Patient Care Team: [name], MD  as PCP - General (Family Medicine) West Bali, MD (Inactive) as Consulting Physician (Gastroenterology) Erroll Luna, [facility] as Pharmacist (Pharmacist)  Indicate any recent Medical Services you may have received from other than [facility] providers in the past year (date may be approximate).     Assessment:   This is a routine wellness examination for Mylei.  Hearing/Vision screen No results found.  Dietary issues and exercise activities discussed: Current Exercise Habits: Home exercise routine, Type of exercise: walking (walks around the house about 30 to ), Time (Minutes): 40, Frequency (Times/Week): 5, Weekly Exercise (Minutes/Week): 200,  Intensity: Mild, Exercise limited by: cardiac condition(s);orthopedic condition(s)   Goals Addressed   None    Depression Screen    12/17/2021    3:41 PM 01/21/2021    8:08 AM 10/08/2020    8:19 AM 06/10/2020    8:27 AM 11/14/2019    9:36 AM 08/25/2017    7:58 AM  PHQ 2/9 Scores  PHQ - 2 Score 0 0 0 0 0 0    Fall Risk    01/21/2021    8:08 AM 10/08/2020    8:18 AM 06/10/2020    8:27 AM 11/14/2019    9:36 AM 01/18/2019    8:03 AM  Fall Risk   Falls in the past year? 1 0 1 1 1   Number falls in past yr: 0 0 0 0 0  Injury with Fall? 0 0 0 0 1  Risk for fall due to : No Fall Risks No Fall Risks History of fall(s);No Fall Risks    Follow up Falls evaluation completed Falls evaluation completed Falls evaluation completed  Falls evaluation completed    FALL RISK PREVENTION PERTAINING TO THE HOME:  Any stairs in or around the home? No  If so, are there any without handrails? No  Home free of loose throw rugs in walkways, pet beds, electrical cords, etc? No  Adequate lighting in your home to reduce risk of falls? Yes   ASSISTIVE DEVICES UTILIZED TO PREVENT FALLS:  Life alert? No  Use of a cane, walker or w/c? No  Grab bars in the bathroom? No  Shower chair or bench in shower? No  Elevated toilet seat or a handicapped toilet?  No   TIMED UP AND GO:  Was the test performed? No .  Length of time to ambulate 10 feet:     Cognitive Function:        11/14/2019    9:36 AM  6CIT Screen  What Year? 0 points  What month? 0 points  What time? 0 points  Count back from 20 0 points  Months in reverse 0 points  Repeat phrase 0 points  Total Score 0 points    Immunizations Immunization History  Administered Date(s) Administered   Fluad Quad(high Dose 65+) 12/25/2018   Influenza, High Dose Seasonal PF 01/18/2018   Influenza,inj,Quad PF,6+ Mos 01/08/2013, 12/25/2013, 12/16/2014, 12/17/2015, 12/27/2016   Pneumococcal Conjugate-13 09/18/2014   Pneumococcal Polysaccharide-23 12/28/2007, 12/25/2013   Td 12/27/1998   Tdap 09/10/2015    TDAP status: Up to date  Flu Vaccine status: Declined, Education has been provided regarding the importance of this vaccine but patient still declined. Advised may receive this vaccine at local pharmacy or Health Dept. Aware to provide a copy of the vaccination record if obtained from local pharmacy or Health Dept. Verbalized acceptance and understanding.  Pneumococcal vaccine status: Declined,  Education has been provided regarding the importance of this vaccine but patient still declined. Advised may receive this vaccine at local pharmacy or Health Dept. Aware to provide a copy of the vaccination record if obtained from local pharmacy or Health Dept. Verbalized acceptance and understanding.   Covid-19 vaccine status: Declined, Education has been provided regarding the importance of this vaccine but patient still declined. Advised may receive this vaccine at local pharmacy or Health Dept.or vaccine clinic. Aware to provide a copy of the vaccination record if obtained from local pharmacy or Health Dept. Verbalized acceptance and understanding.  Qualifies for Shingles Vaccine? Yes   Zostavax completed Yes   Shingrix Completed?: Yes  Screening Tests Health Maintenance  Topic Date  Due   Hepatitis C Screening  Never done   Zoster Vaccines- Shingrix (1 of 2) Never done   INFLUENZA VACCINE  10/26/2021   TETANUS/TDAP  09/09/2025   Pneumonia Vaccine 365+ Years old  Completed   DEXA SCAN  Completed   HPV VACCINES  Aged Out   COLONOSCOPY (Pts [id number] Insurance coverage will need to be confirmed)  Discontinued   COVID-19 Vaccine  Discontinued    Health Maintenance  Health Maintenance Due  Topic Date Due   Hepatitis C Screening  Never done   Zoster Vaccines- Shingrix (1 of 2) Never done   INFLUENZA VACCINE  10/26/2021    Colorectal cancer screening: No longer required.   Mammogram status: No longer required due to age.  Bone Density status: Completed 01/26/21. Results reflect: Bone density results: OSTEOPOROSIS. Repeat every 2 years.  Lung Cancer Screening: (Low Dose CT Chest recommended if Age 70-80 years, 30 pack-year currently smoking OR have quit w/in 15years.) does not qualify.   Lung Cancer Screening Referral: no  Additional Screening:  Hepatitis C Screening: does not qualify;   Vision Screening: Recommended annual ophthalmology exams for early detection of glaucoma and other disorders of the eye. Is the patient up to date with their annual eye exam?  Yes  Who is the provider or what is the name of the office in which the patient attends annual eye exams? Dr. Dione BoozeGroat If pt is not established with a provider, would they like to be referred to a provider to establish care? No .   Dental Screening: Recommended annual dental exams for proper oral hygiene  Community Resource Referral / Chronic Care Management: CRR required this visit?  No   CCM required this visit?  No      Plan:     I have personally reviewed and noted the following in the patient's chart:   Medical and social history Use of alcohol, tobacco or illicit drugs  Current medications and supplements including opioid prescriptions. Patient is not currently taking opioid  prescriptions. Functional ability and status Nutritional status Physical activity Advanced directives List of other physicians Hospitalizations, surgeries, and ER visits in previous 12 months Vitals Screenings to include cognitive, depression, and falls Referrals and appointments  In addition, I have reviewed and discussed with patient certain preventive protocols, quality metrics, and best practice recommendations. A written personalized care plan for preventive services as well as general preventive health recommendations were provided to patient.     Arta SilenceBlia  Esperansa Sarabia, [facility]MA   12/17/2021   Nurse Notes: discuss vaccine and heath maintenance with pt

## 2021-12-22 ENCOUNTER — Other Ambulatory Visit: Payer: Self-pay | Admitting: Family Medicine

## 2021-12-22 NOTE — Telephone Encounter (Signed)
Requested medication (s) are due for refill today:   Yes  Requested medication (s) are on the active medication list:   Yes  Future visit scheduled:   No   Last ordered: 09/22/2020 #30, 3 refills  Returned because labs are due per protocol.     No refills remain   Requested Prescriptions  Pending Prescriptions Disp Refills   piroxicam (FELDENE) 20 MG capsule [Pharmacy Med Name: Piroxicam 20 MG Oral Capsule] 30 capsule 0    Sig: TAKE 1 CAPSULE BY MOUTH ONCE DAILY AS NEEDED FOR PAIN     Analgesics:  NSAIDS Failed - 12/22/2021  9:50 AM      Failed - Manual Review: Labs are only required if the patient has taken medication for more than 8 weeks.      Failed - Cr in normal range and within 360 days    Creat  Date Value Ref Range Status  10/08/2020 1.09 (H) 0.60 - 1.00 mg/dL Final         Failed - HGB in normal range and within 360 days    Hemoglobin  Date Value Ref Range Status  10/08/2020 11.3 (L) 11.7 - 15.5 g/dL Final         Failed - PLT in normal range and within 360 days    Platelets  Date Value Ref Range Status  10/08/2020 315 140 - 400 Thousand/uL Final         Failed - HCT in normal range and within 360 days    HCT  Date Value Ref Range Status  10/08/2020 34.6 (L) 35.0 - 45.0 % Final         Failed - eGFR is 30 or above and within 360 days    GFR, Est African American  Date Value Ref Range Status  06/10/2020 52 (L) > OR = 60 mL/min/1.2m Final   GFR, Est Non African American  Date Value Ref Range Status  06/10/2020 44 (L) > OR = 60 mL/min/1.762mFinal   eGFR  Date Value Ref Range Status  10/08/2020 52 (L) > OR = 60 mL/min/1.7366minal    Comment:    The eGFR is based on the CKD-EPI 2021 equation. To calculate  the new eGFR from a previous Creatinine or Cystatin C result, go to https://www.kidney.org/professionals/ kdoqi/gfr%5Fcalculator          Passed - Patient is not pregnant      Passed - Valid encounter within last 12 months    Recent  Outpatient Visits           8 months ago COVWhartonarCammie McgeeD   11 months ago Nonrheumatic aortic valve insufficiency   BroArecibocDennard SchaumannarCammie McgeeD   1 year ago Benign essential HTN   BroMasaryktownckard, WarCammie McgeeD   1 year ago Benign essential HTN   BroHaydencDennard SchaumannrCammie McgeeD   2 years ago Anemia, unspecified type   BroMalverneckard, WarCammie McgeeD

## 2021-12-30 ENCOUNTER — Ambulatory Visit: Payer: Self-pay

## 2022-02-08 ENCOUNTER — Other Ambulatory Visit: Payer: Self-pay | Admitting: Family Medicine

## 2022-02-08 NOTE — Telephone Encounter (Signed)
Requested medications are due for refill today.  yes  Requested medications are on the active medications list.  yes  Last refill. 12/22/2021 #30 1TM  Future visit scheduled.   no  Notes to clinic.  Labs are expired pt needs an appt.    Requested Prescriptions  Pending Prescriptions Disp Refills   piroxicam (FELDENE) 20 MG capsule [Pharmacy Med Name: Piroxicam 20 MG Oral Capsule] 30 capsule 0    Sig: TAKE 1 CAPSULE BY MOUTH ONCE DAILY AS NEEDED FOR PAIN . APPOINTMENT REQUIRED FOR FUTURE REFILLS     Analgesics:  NSAIDS Failed - 02/08/2022  7:03 AM      Failed - Manual Review: Labs are only required if the patient has taken medication for more than 8 weeks.      Failed - Cr in normal range and within 360 days    Creat  Date Value Ref Range Status  10/08/2020 1.09 (H) 0.60 - 1.00 mg/dL Final         Failed - HGB in normal range and within 360 days    Hemoglobin  Date Value Ref Range Status  10/08/2020 11.3 (L) 11.7 - 15.5 g/dL Final         Failed - PLT in normal range and within 360 days    Platelets  Date Value Ref Range Status  10/08/2020 315 140 - 400 Thousand/uL Final         Failed - HCT in normal range and within 360 days    HCT  Date Value Ref Range Status  10/08/2020 34.6 (L) 35.0 - 45.0 % Final         Failed - eGFR is 30 or above and within 360 days    GFR, Est African American  Date Value Ref Range Status  06/10/2020 52 (L) > OR = 60 mL/min/1.33m Final   GFR, Est Non African American  Date Value Ref Range Status  06/10/2020 44 (L) > OR = 60 mL/min/1.713mFinal   eGFR  Date Value Ref Range Status  10/08/2020 52 (L) > OR = 60 mL/min/1.7334minal    Comment:    The eGFR is based on the CKD-EPI 2021 equation. To calculate  the new eGFR from a previous Creatinine or Cystatin C result, go to https://www.kidney.org/professionals/ kdoqi/gfr%5Fcalculator          Passed - Patient is not pregnant      Passed - Valid encounter within last 12 months     Recent Outpatient Visits           10 months ago COV[facility]arCammie McgeeD   1 year ago Nonrheumatic aortic valve insufficiency   BroCovecDennard SchaumannarCammie McgeeD   1 year ago Benign essential HTN   BroEsperanceckard, WarCammie McgeeD   1 year ago Benign essential HTN   BroMcAllencDennard SchaumannrCammie McgeeD   2 years ago Anemia, unspecified type   BroCarnesvilleckard, WarCammie McgeeD

## 2022-02-28 ENCOUNTER — Other Ambulatory Visit: Payer: Self-pay | Admitting: Family Medicine

## 2022-03-23 ENCOUNTER — Other Ambulatory Visit: Payer: Self-pay | Admitting: Family Medicine

## 2022-04-21 ENCOUNTER — Ambulatory Visit: Payer: Medicare HMO | Admitting: Family Medicine

## 2022-04-22 ENCOUNTER — Encounter: Payer: Self-pay | Admitting: Family Medicine

## 2022-04-22 ENCOUNTER — Ambulatory Visit (INDEPENDENT_AMBULATORY_CARE_PROVIDER_SITE_OTHER): Payer: Medicare HMO | Admitting: Family Medicine

## 2022-04-22 VITALS — BP 120/62 | HR 59 | Ht 64.0 in | Wt 120.2 lb

## 2022-04-22 DIAGNOSIS — E7800 Pure hypercholesterolemia, unspecified: Secondary | ICD-10-CM | POA: Diagnosis not present

## 2022-04-22 DIAGNOSIS — D649 Anemia, unspecified: Secondary | ICD-10-CM

## 2022-04-22 DIAGNOSIS — N1831 Chronic kidney disease, stage 3a: Secondary | ICD-10-CM

## 2022-04-22 DIAGNOSIS — I10 Essential (primary) hypertension: Secondary | ICD-10-CM | POA: Diagnosis not present

## 2022-04-22 NOTE — Progress Notes (Signed)
Subjective:    Patient ID: Julia Robinson, female    DOB: 28-Feb-1941, 82 y.o.   MRN: [id number]  She is here today for follow-up.  She has a history of HTN, HLD, and aortic valve insufficiency.  DEXA 11/22 showed osteoporosis.  Patient independently stopped taking Fosamax because it was too complicated for her to take once a week.  She is not currently taking anything for osteoporosis.  Her blood pressure today is excellent.  She is long overdue for fasting lab work.  She does have a history of stage III chronic kidney disease.  She denies any chest pain shortness of breath or dyspnea on exertion Past Medical History:  Diagnosis Date   Diverticulosis    High cholesterol    Hyperlipidemia    Hypertension    Moderate aortic regurgitation    with mild aortic stenosis echo 10/20   Osteoporosis    Prolapsed uterus    Past Surgical History:  Procedure Laterality Date   ABDOMINAL HYSTERECTOMY     A+P repair 2007   BACK SURGERY     CLOSED REDUCTION FINGER WITH PERCUTANEOUS PINNING Left 09/10/2015   Procedure: CLOSED REDUCTION FINGER WITH PERCUTANEOUS PINNING;  Surgeon: Leanora Cover, MD;  Location: [facility];  Service: Orthopedics;  Laterality: Left;   I & D EXTREMITY Left 09/10/2015   Procedure: IRRIGATION AND DEBRIDEMENT THUMB, REPAIR OF SKIN AND NAIL BED;  Surgeon: Leanora Cover, MD;  Location: [facility];  Service: Orthopedics;  Laterality: Left;   Current Outpatient Medications on File Prior to Visit  Medication Sig Dispense Refill   alendronate (FOSAMAX) 70 MG tablet Take 1 tablet (70 mg total) by mouth every 7 (seven) days. Take with a full glass of water on an empty stomach. 12 tablet 3   aspirin 81 MG tablet Take 81 mg by mouth daily.     Calcium Carbonate-Vitamin D3 600-400 MG-UNIT TABS Take by mouth.     Cyanocobalamin 1000 MCG CAPS Take 1 capsule by mouth daily.     doxazosin (CARDURA) 2 MG tablet Take 1 tablet (2 mg total) by mouth daily. 90 tablet 3   lovastatin (MEVACOR) 20 MG tablet  TAKE 1 TABLET AT BEDTIME (NEED MD APPOINTMENT FOR REFILLS) 90 tablet 3   Magnesium 400 MG CAPS Take 1 tablet by mouth daily.      piroxicam (FELDENE) 20 MG capsule TAKE 1 CAPSULE BY MOUTH ONCE DAILY AS NEEDED FOR PAIN . APPOINTMENT REQUIRED FOR FUTURE REFILLS 30 capsule 0   Potassium Gluconate 595 MG CAPS Take 1 capsule by mouth daily.      No current facility-administered medications on file prior to visit.   Allergies  Allergen Reactions   Ace Inhibitors     Cough   Amlodipine     Swelling   Codeine     unknown   Crestor [Rosuvastatin]     unknown   Dramamine [Dimenhydrinate]     unknown   Lipitor [Atorvastatin]     Constipation   Quinine Derivatives     Hives   Sulfa Antibiotics     unknown   Social History   Socioeconomic History   Marital status: Married    Spouse name: Not on file   Number of children: Not on file   Years of education: Not on file   Highest education level: Not on file  Occupational History   Not on file  Tobacco Use   Smoking status: Never   Smokeless tobacco: Former    Types:  Chew  Substance and Sexual Activity   Alcohol use: No   Drug use: No   Sexual activity: Yes  Other Topics Concern   Not on file  Social History Narrative   Not on file   Social Determinants of Health   Financial Resource Strain: Low Risk  (12/17/2021)   Overall Financial Resource Strain (CARDIA)    Difficulty of Paying Living Expenses: Not hard at all  Food Insecurity: No Food Insecurity (12/17/2021)   Hunger Vital Sign    Worried About Running Out of Food in the Last Year: Never true    Ran Out of Food in the Last Year: Never true  Transportation Needs: No Transportation Needs (12/17/2021)   PRAPARE - Hydrologist (Medical): No    Lack of Transportation (Non-Medical): No  Physical Activity: Sufficiently Active (12/17/2021)   Exercise Vital Sign    Days of Exercise per Week: 5 days    Minutes of Exercise per Session: 40 min   Stress: No Stress Concern Present (12/17/2021)   [place]    Feeling of Stress : Only a little  Social Connections: Moderately Integrated (12/17/2021)   Social Connection and Isolation Panel [NHANES]    Frequency of Communication with Friends and Family: Once a week    Frequency of Social Gatherings with Friends and Family: Once a week    Attends Religious Services: 1 to 4 times per year    Active Member of [employer] or Organizations: Yes    Attends Archivist Meetings: 1 to 4 times per year    Marital Status: Married  Human resources officer Violence: Not At Risk (12/17/2021)   Humiliation, Afraid, Rape, and Kick questionnaire    Fear of Current or Ex-Partner: No    Emotionally Abused: No    Physically Abused: No    Sexually Abused: No   Family History  Problem Relation Age of Onset   Heart disease Mother    Diabetes Sister    Hypertension Sister    Hyperlipidemia Sister    Cancer Maternal Aunt        lung      Review of Systems  All other systems reviewed and are negative.      Objective:   Physical Exam Vitals reviewed.  Constitutional:      General: She is not in acute distress.    Appearance: She is well-developed. She is not diaphoretic.  HENT:     Head: Normocephalic and atraumatic.     Right Ear: External ear normal.     Left Ear: External ear normal.     Nose: Nose normal.     Mouth/Throat:     Pharynx: No oropharyngeal exudate.  Eyes:     General: No scleral icterus.       Right eye: No discharge.        Left eye: No discharge.     Conjunctiva/sclera: Conjunctivae normal.     Pupils: Pupils are equal, round, and reactive to light.  Neck:     Thyroid: No thyromegaly.     Vascular: No JVD.     Trachea: No tracheal deviation.  Cardiovascular:     Rate and Rhythm: Normal rate and regular rhythm.     Heart sounds: Murmur heard.     No friction rub. No gallop.  Pulmonary:     Effort:  Pulmonary effort is normal. No respiratory distress.     Breath sounds: Normal  breath sounds. No stridor. No wheezing or rales.  Chest:     Chest wall: No tenderness.  Abdominal:     General: Bowel sounds are normal. There is no distension.     Palpations: Abdomen is soft. There is no mass.     Tenderness: There is no abdominal tenderness. There is no guarding or rebound.  Musculoskeletal:        General: No tenderness. Normal range of motion.     Cervical back: Normal range of motion and neck supple.  Lymphadenopathy:     Cervical: No cervical adenopathy.  Skin:    General: Skin is warm.     Coloration: Skin is not pale.     Findings: No erythema or rash.  Neurological:     Mental Status: She is alert and oriented to person, place, and time.     Cranial Nerves: No cranial nerve deficit.     Motor: No abnormal muscle tone.     Coordination: Coordination normal.     Deep Tendon Reflexes: Reflexes are normal and symmetric.  Psychiatric:        Behavior: Behavior normal.        Thought Content: Thought content normal.        Judgment: Judgment normal.           Assessment & Plan:  Benign essential HTN - Plan: CBC with Differential/Platelet, COMPLETE METABOLIC PANEL WITH GFR, Lipid panel  Pure hypercholesterolemia - Plan: CBC with Differential/Platelet, COMPLETE METABOLIC PANEL WITH GFR, Lipid panel  Anemia, unspecified type  Stage 3a chronic kidney disease (HCC) Blood pressure today is acceptable.  Check CBC, CMP, lipid panel.  Monitor her chronic kidney disease.  Monitor her history of chronic anemia by checking a hemoglobin.  As long as her hemoglobin is greater than 11 this is her baseline.  Recommended trying Prolia to treat her osteoporosis.  Goal LDL cholesterol is less than 100.  Recommended a COVID shot and a flu shot which she refuses

## 2022-04-23 LAB — COMPLETE METABOLIC PANEL WITH GFR
AG Ratio: 1.7 (calc) (ref 1.0–2.5)
ALT: 9 U/L (ref 6–29)
AST: 14 U/L (ref 10–35)
Albumin: 4 g/dL (ref 3.6–5.1)
Alkaline phosphatase (APISO): 56 U/L (ref 37–153)
BUN/Creatinine Ratio: 13 (calc) (ref 6–22)
BUN: 14 mg/dL (ref 7–25)
CO2: 26 mmol/L (ref 20–32)
Calcium: 9.4 mg/dL (ref 8.6–10.4)
Chloride: 105 mmol/L (ref 98–110)
Creat: 1.09 mg/dL — ABNORMAL HIGH (ref 0.60–1.00)
Globulin: 2.3 g/dL (calc) (ref 1.9–3.7)
Glucose, Bld: 87 mg/dL (ref 65–99)
Potassium: 3.9 mmol/L (ref 3.5–5.3)
Sodium: 141 mmol/L (ref 135–146)
Total Bilirubin: 0.4 mg/dL (ref 0.2–1.2)
Total Protein: 6.3 g/dL (ref 6.1–8.1)
eGFR: 52 mL/min/{1.73_m2} — ABNORMAL LOW (ref >=60)

## 2022-04-23 LAB — CBC WITH DIFFERENTIAL/PLATELET
Absolute Monocytes: 418 cells/uL (ref 200–950)
Basophils Absolute: 62 cells/uL (ref 0–200)
Basophils Relative: 1.4 %
Eosinophils Absolute: 132 cells/uL (ref 15–500)
Eosinophils Relative: 3 %
HCT: 33 % — ABNORMAL LOW (ref 35.0–45.0)
Hemoglobin: 11 g/dL — ABNORMAL LOW (ref 11.7–15.5)
Lymphs Abs: 1399 cells/uL (ref 850–3900)
MCH: 30.1 pg (ref 27.0–33.0)
MCHC: 33.3 g/dL (ref 32.0–36.0)
MCV: 90.4 fL (ref 80.0–100.0)
MPV: 10 fL (ref 7.5–12.5)
Monocytes Relative: 9.5 %
Neutro Abs: 2389 cells/uL (ref 1500–7800)
Neutrophils Relative %: 54.3 %
Platelets: 239 10*3/uL (ref 140–400)
RBC: 3.65 10*6/uL — ABNORMAL LOW (ref 3.80–5.10)
RDW: 12.9 % (ref 11.0–15.0)
Total Lymphocyte: 31.8 %
WBC: 4.4 10*3/uL (ref 3.8–10.8)

## 2022-04-23 LAB — LIPID PANEL
Cholesterol: 165 mg/dL (ref <200)
HDL: 78 mg/dL (ref >=50)
LDL Cholesterol (Calc): 71 mg/dL (calc)
Non-HDL Cholesterol (Calc): 87 mg/dL (calc) (ref <130)
Total CHOL/HDL Ratio: 2.1 (calc) (ref <5.0)
Triglycerides: 75 mg/dL (ref <150)

## 2022-05-16 ENCOUNTER — Other Ambulatory Visit: Payer: Self-pay | Admitting: Family Medicine

## 2022-06-08 ENCOUNTER — Telehealth: Payer: Self-pay

## 2022-06-08 NOTE — Telephone Encounter (Signed)
Per pt's insurance, she will have a 20% co-pay for Prolia in the  amount of $315.00. Pt declined Prolia due to cost and does not want to do Fosamax either. Pt advised that she is able to make payments on the co-pay but patient still declined. Thank you.

## 2022-10-11 ENCOUNTER — Other Ambulatory Visit: Payer: Self-pay | Admitting: Family Medicine

## 2022-10-28 ENCOUNTER — Encounter: Payer: Self-pay | Admitting: Family Medicine

## 2022-10-28 ENCOUNTER — Ambulatory Visit (INDEPENDENT_AMBULATORY_CARE_PROVIDER_SITE_OTHER): Payer: Medicare HMO | Admitting: Family Medicine

## 2022-10-28 VITALS — BP 130/76 | HR 63 | Temp 97.7°F | Ht 64.0 in | Wt 124.4 lb

## 2022-10-28 DIAGNOSIS — N1831 Chronic kidney disease, stage 3a: Secondary | ICD-10-CM

## 2022-10-28 DIAGNOSIS — I10 Essential (primary) hypertension: Secondary | ICD-10-CM | POA: Diagnosis not present

## 2022-10-28 DIAGNOSIS — L989 Disorder of the skin and subcutaneous tissue, unspecified: Secondary | ICD-10-CM

## 2022-10-28 DIAGNOSIS — D649 Anemia, unspecified: Secondary | ICD-10-CM

## 2022-10-28 DIAGNOSIS — R011 Cardiac murmur, unspecified: Secondary | ICD-10-CM | POA: Diagnosis not present

## 2022-10-28 DIAGNOSIS — E7800 Pure hypercholesterolemia, unspecified: Secondary | ICD-10-CM

## 2022-10-28 LAB — CBC WITH DIFFERENTIAL/PLATELET
Absolute Monocytes: 437 cells/uL (ref 200–950)
Basophils Absolute: 42 cells/uL (ref 0–200)
Basophils Relative: 0.8 %
Eosinophils Absolute: 130 cells/uL (ref 15–500)
Eosinophils Relative: 2.5 %
HCT: 34 % — ABNORMAL LOW (ref 35.0–45.0)
Hemoglobin: 11.1 g/dL — ABNORMAL LOW (ref 11.7–15.5)
Lymphs Abs: 1472 cells/uL (ref 850–3900)
MCH: 29.1 pg (ref 27.0–33.0)
MCHC: 32.6 g/dL (ref 32.0–36.0)
MCV: 89 fL (ref 80.0–100.0)
MPV: 10 fL (ref 7.5–12.5)
Monocytes Relative: 8.4 %
Neutro Abs: 3120 cells/uL (ref 1500–7800)
Neutrophils Relative %: 60 %
Platelets: 280 10*3/uL (ref 140–400)
RBC: 3.82 10*6/uL (ref 3.80–5.10)
RDW: 13.4 % (ref 11.0–15.0)
Total Lymphocyte: 28.3 %
WBC: 5.2 10*3/uL (ref 3.8–10.8)

## 2022-10-28 NOTE — Progress Notes (Signed)
Subjective:    Patient ID: Julia Robinson, female    DOB: [birth year], 82 y.o.   MRN: [id number]  She is here today for follow-up.  She has a history of HTN, HLD, and aortic valve insufficiency.  She does have a history of stage III chronic kidney disease.  2 years ago I checked an echocardiogram due to a murmur.  She had some mild aortic insufficiency.  However today on exam her murmur is much louder.  It is 3-4/6.  She denies any chest pain or orthopnea.  She denies any shortness of breath with activity.  There is no peripheral edema on exam.  Otherwise she is asymptomatic.  Her blood pressure is excellent.  She continues to refuse any treatment for osteoporosis other than calcium and vitamin D.  She continues to refuse a mammogram.  Immunizations are up-to-date.  Of note, on her left nostril there is a nonhealing lesion is approximately 5 mm in diameter.  It was their last visit and I recommended a dermatology consultation.  I strongly encouraged her to see a dermatologist and she is willing to allow me to schedule an appointment Past Medical History:  Diagnosis Date   Diverticulosis    High cholesterol    Hyperlipidemia    Hypertension    Moderate aortic regurgitation    with mild aortic stenosis echo 10/20   Osteoporosis    Prolapsed uterus    Past Surgical History:  Procedure Laterality Date   ABDOMINAL HYSTERECTOMY     A+P repair 2007   BACK SURGERY     CLOSED REDUCTION FINGER WITH PERCUTANEOUS PINNING Left 09/10/2015   Procedure: CLOSED REDUCTION FINGER WITH PERCUTANEOUS PINNING;  Surgeon: Betha Loa, MD;  Location: [facility];  Service: Orthopedics;  Laterality: Left;   I & D EXTREMITY Left 09/10/2015   Procedure: IRRIGATION AND DEBRIDEMENT THUMB, REPAIR OF SKIN AND NAIL BED;  Surgeon: Betha Loa, MD;  Location: [facility];  Service: Orthopedics;  Laterality: Left;   Current Outpatient Medications on File Prior to Visit  Medication Sig Dispense Refill   alendronate (FOSAMAX) 70 MG  tablet Take 1 tablet (70 mg total) by mouth every 7 (seven) days. Take with a full glass of water on an empty stomach. 12 tablet 3   aspirin 81 MG tablet Take 81 mg by mouth daily.     Calcium Carbonate-Vitamin D3 600-400 MG-UNIT TABS Take by mouth.     Cyanocobalamin 1000 MCG CAPS Take 1 capsule by mouth daily.     doxazosin (CARDURA) 2 MG tablet TAKE 1 TABLET EVERY DAY 90 tablet 3   lovastatin (MEVACOR) 20 MG tablet TAKE 1 TABLET AT BEDTIME (NEED MD APPOINTMENT FOR REFILLS) 90 tablet 3   Magnesium 400 MG CAPS Take 1 tablet by mouth daily.      piroxicam (FELDENE) 20 MG capsule TAKE 1 CAPSULE BY MOUTH ONCE DAILY AS NEEDED FOR  PAIN. APPOINTMENT REQUIRED FOR FUTURE REFILLS. 30 capsule 0   Potassium Gluconate 595 MG CAPS Take 1 capsule by mouth daily.      No current facility-administered medications on file prior to visit.   Allergies  Allergen Reactions   Ace Inhibitors     Cough   Amlodipine     Swelling   Codeine     unknown   Crestor [Rosuvastatin]     unknown   Dramamine [Dimenhydrinate]     unknown   Lipitor [Atorvastatin]     Constipation   Quinine Derivatives  Hives   Sulfa Antibiotics     unknown   Social History   Socioeconomic History   Marital status: Married    Spouse name: Not on file   Number of children: Not on file   Years of education: Not on file   Highest education level: Not on file  Occupational History   Not on file  Tobacco Use   Smoking status: Never   Smokeless tobacco: Former    Types: Chew  Substance and Sexual Activity   Alcohol use: No   Drug use: No   Sexual activity: Yes  Other Topics Concern   Not on file  Social History Narrative   Not on file   Social Determinants of Health   Financial Resource Strain: Low Risk  (12/17/2021)   Overall Financial Resource Strain (CARDIA)    Difficulty of Paying Living Expenses: Not hard at all  Food Insecurity: No Food Insecurity (12/17/2021)   Hunger Vital Sign    Worried About Running  Out of Food in the Last Year: Never true    Ran Out of Food in the Last Year: Never true  Transportation Needs: No Transportation Needs (12/17/2021)   PRAPARE - Administrator, Civil Service (Medical): No    Lack of Transportation (Non-Medical): No  Physical Activity: Sufficiently Active (12/17/2021)   Exercise Vital Sign    Days of Exercise per Week: 5 days    Minutes of Exercise per Session: 40 min  Stress: No Stress Concern Present (12/17/2021)   [employer] of Occupational Health - Occupational Stress Questionnaire    Feeling of Stress : Only a little  Social Connections: Moderately Integrated (12/17/2021)   Social Connection and Isolation Panel [NHANES]    Frequency of Communication with Friends and Family: Once a week    Frequency of Social Gatherings with Friends and Family: Once a week    Attends Religious Services: 1 to 4 times per year    Active Member of [employer] or Organizations: Yes    Attends Banker Meetings: 1 to 4 times per year    Marital Status: Married  Catering manager Violence: Not At Risk (12/17/2021)   Humiliation, Afraid, Rape, and Kick questionnaire    Fear of Current or Ex-Partner: No    Emotionally Abused: No    Physically Abused: No    Sexually Abused: No   Family History  Problem Relation Age of Onset   Heart disease Mother    Diabetes Sister    Hypertension Sister    Hyperlipidemia Sister    Cancer Maternal Aunt        lung      Review of Systems  All other systems reviewed and are negative.      Objective:   Physical Exam Vitals reviewed.  Constitutional:      General: She is not in acute distress.    Appearance: She is well-developed. She is not diaphoretic.  HENT:     Head: Normocephalic and atraumatic.     Right Ear: External ear normal.     Left Ear: External ear normal.     Nose: Nose normal.      Mouth/Throat:     Pharynx: No oropharyngeal exudate.  Eyes:     General: No scleral icterus.        Right eye: No discharge.        Left eye: No discharge.     Conjunctiva/sclera: Conjunctivae normal.     Pupils: Pupils are equal,  round, and reactive to light.  Neck:     Thyroid: No thyromegaly.     Vascular: No JVD.     Trachea: No tracheal deviation.  Cardiovascular:     Rate and Rhythm: Normal rate and regular rhythm.     Heart sounds: Murmur heard.     No friction rub. No gallop.  Pulmonary:     Effort: Pulmonary effort is normal. No respiratory distress.     Breath sounds: Normal breath sounds. No stridor. No wheezing or rales.  Chest:     Chest wall: No tenderness.  Abdominal:     General: Bowel sounds are normal. There is no distension.     Palpations: Abdomen is soft. There is no mass.     Tenderness: There is no abdominal tenderness. There is no guarding or rebound.  Musculoskeletal:        General: No tenderness. Normal range of motion.     Cervical back: Normal range of motion and neck supple.  Lymphadenopathy:     Cervical: No cervical adenopathy.  Skin:    General: Skin is warm.     Coloration: Skin is not pale.     Findings: No erythema or rash.  Neurological:     Mental Status: She is alert and oriented to person, place, and time.     Cranial Nerves: No cranial nerve deficit.     Motor: No abnormal muscle tone.     Coordination: Coordination normal.     Deep Tendon Reflexes: Reflexes are normal and symmetric.  Psychiatric:        Behavior: Behavior normal.        Thought Content: Thought content normal.        Judgment: Judgment normal.           Assessment & Plan:  Benign essential HTN - Plan: Lipid panel, CBC with Differential/Platelet, COMPLETE METABOLIC PANEL WITH GFR  Anemia, unspecified type - Plan: Lipid panel, CBC with Differential/Platelet, COMPLETE METABOLIC PANEL WITH GFR  Stage 3a chronic kidney disease (HCC) - Plan: Lipid panel, CBC with Differential/Platelet, COMPLETE METABOLIC PANEL WITH GFR  Pure hypercholesterolemia - Plan: Lipid  panel, CBC with Differential/Platelet, COMPLETE METABOLIC PANEL WITH GFR  Non-healing skin lesion of nose - Plan: Ambulatory referral to Dermatology  Cardiac murmur - Plan: ECHOCARDIOGRAM COMPLETE I believe the lesion on her nose is skin cancer.  I recommended dermatology consultation for an excisional biopsy.  She currently agrees and will allow me to schedule this.  Check CBC CMP and fasting lipid panel.  Baseline hemoglobin is 11.  This is chronic and stable.  Blood pressure today is excellent.  Try to keep her LDL cholesterol less than 161.  I am concerned that her murmur sounds much worse and so I will repeat an echocardiogram.  She refuses treatment for osteoporosis.  She declines a mammogram

## 2022-10-31 ENCOUNTER — Telehealth: Payer: Self-pay

## 2022-10-31 MED ORDER — LOVASTATIN 20 MG PO TABS: ORAL_TABLET | ORAL | 3 refills | Status: DC

## 2022-10-31 MED ORDER — PIROXICAM 20 MG PO CAPS: ORAL_CAPSULE | ORAL | 0 refills | Status: DC

## 2022-10-31 MED ORDER — DOXAZOSIN MESYLATE 2 MG PO TABS: 2.0000 mg | ORAL_TABLET | Freq: Every day | ORAL | 3 refills | Status: DC

## 2022-10-31 NOTE — Telephone Encounter (Signed)
Pt called in to ask nurse would give a cb about her refills. Pt states that she was just seen by pcp for a medcheck visit and is still not able to get her refills. Pt asks if nurse would please call pt back to help get her meds refilled. Pt states that she needs most, if not all of her meds refilled please.  Cb#: [phone]

## 2022-11-09 ENCOUNTER — Ambulatory Visit (HOSPITAL_COMMUNITY): Payer: Medicare HMO | Attending: Family Medicine

## 2022-11-09 ENCOUNTER — Telehealth: Payer: Self-pay | Admitting: Family Medicine

## 2022-11-09 DIAGNOSIS — R011 Cardiac murmur, unspecified: Secondary | ICD-10-CM | POA: Diagnosis not present

## 2022-11-09 LAB — ECHOCARDIOGRAM COMPLETE
AV Vena cont: 0.4 cm
Area-P 1/2: 3.72 cm2
Calc EF: 63.9 %
P 1/2 time: 389 msec
S' Lateral: 2.8 cm
Single Plane A2C EF: 57.7 %
Single Plane A4C EF: 67 %

## 2022-11-09 NOTE — Telephone Encounter (Signed)
Patient called to follow up on recent visit with provider and echo recommended. Patient awaiting call back for scheduling.   Please advise at [phone].

## 2022-12-20 ENCOUNTER — Ambulatory Visit: Payer: Medicare HMO

## 2022-12-27 ENCOUNTER — Encounter: Payer: Self-pay | Admitting: Family Medicine

## 2022-12-27 ENCOUNTER — Ambulatory Visit (INDEPENDENT_AMBULATORY_CARE_PROVIDER_SITE_OTHER): Payer: Medicare HMO | Admitting: Family Medicine

## 2022-12-27 ENCOUNTER — Ambulatory Visit (HOSPITAL_COMMUNITY)
Admission: RE | Admit: 2022-12-27 | Discharge: 2022-12-27 | Disposition: A | Discharge status: Home / Self Care | Payer: Medicare HMO | Source: Ambulatory Visit | Attending: Family Medicine | Admitting: Family Medicine

## 2022-12-27 VITALS — BP 184/78 | HR 78 | Temp 97.7°F | Ht 64.0 in | Wt 126.0 lb

## 2022-12-27 DIAGNOSIS — I700 Atherosclerosis of aorta: Secondary | ICD-10-CM | POA: Diagnosis not present

## 2022-12-27 DIAGNOSIS — R531 Weakness: Secondary | ICD-10-CM | POA: Insufficient documentation

## 2022-12-27 DIAGNOSIS — Z0389 Encounter for observation for other suspected diseases and conditions ruled out: Secondary | ICD-10-CM | POA: Diagnosis not present

## 2022-12-27 LAB — URINALYSIS, ROUTINE W REFLEX MICROSCOPIC
Bacteria, UA: NONE SEEN /[HPF]
Bilirubin Urine: NEGATIVE
Glucose, UA: NEGATIVE
Hyaline Cast: NONE SEEN /[LPF]
Leukocytes,Ua: NEGATIVE
Nitrite: NEGATIVE
Specific Gravity, Urine: 1.025 (ref 1.001–1.035)
pH: 6 (ref 5.0–8.0)

## 2022-12-27 LAB — MICROSCOPIC MESSAGE

## 2022-12-27 MED ORDER — AMOXICILLIN-POT CLAVULANATE 875-125 MG PO TABS: 1.0000 | ORAL_TABLET | Freq: Two times a day (BID) | ORAL | 0 refills | Status: DC

## 2022-12-27 NOTE — Progress Notes (Signed)
Subjective:    Patient ID: Julia Robinson, female    DOB: 05-Jul-1940, 82 y.o.   MRN: [id number]  Patient states that Saturday she became extremely hot working outside and became extremely weak and had to stop and rest.  However her husband states that over the last week she has been more confused.  She is weak.  She is falling.  He states that she is running fevers at night although they are not checking the temperature.  She feels hot to the touch per his report.  They deny any cough, chest pain, shortness of breath.  They deny any nausea or vomiting or abdominal pain.  They deny any dysuria.  Cranial nerves II through XII are grossly intact with muscle strength 5/5 equal and symmetric in the upper and lower extremities.  The patient is walking normally up and down the hallway.  There is no obvious neurologic deficit.  Here today she appears to be at her baseline mental status.  She is answering questions appropriately.  She is laughing and conversing.  However the family states that at times she is confused.  She does not recall having conversations with her son.  He also is getting more confused at night.  This seems to have occurred over the last week when she has been having fevers at night.  He today is a marked septal crackles female back in shape anteriorly right chest.  I do not appreciate them posteriorly.  Otherwise her exam is normal Past Medical History:  Diagnosis Date   Diverticulosis    High cholesterol    Hyperlipidemia    Hypertension    Moderate aortic regurgitation    with mild aortic stenosis echo 10/20   Osteoporosis    Prolapsed uterus    Past Surgical History:  Procedure Laterality Date   ABDOMINAL HYSTERECTOMY     A+P repair 2007   BACK SURGERY     CLOSED REDUCTION FINGER WITH PERCUTANEOUS PINNING Left 09/10/2015   Procedure: CLOSED REDUCTION FINGER WITH PERCUTANEOUS PINNING;  Surgeon: Betha Loa, MD;  Location: [facility] OR;  Service: Orthopedics;  Laterality: Left;   I &  D EXTREMITY Left 09/10/2015   Procedure: IRRIGATION AND DEBRIDEMENT THUMB, REPAIR OF SKIN AND NAIL BED;  Surgeon: Betha Loa, MD;  Location: [facility] OR;  Service: Orthopedics;  Laterality: Left;   Current Outpatient Medications on File Prior to Visit  Medication Sig Dispense Refill   aspirin 81 MG tablet Take 81 mg by mouth daily.     Calcium Carbonate-Vitamin D3 600-400 MG-UNIT TABS Take by mouth.     Cyanocobalamin 1000 MCG CAPS Take 1 capsule by mouth daily.     doxazosin (CARDURA) 2 MG tablet Take 1 tablet (2 mg total) by mouth daily. 90 tablet 3   lovastatin (MEVACOR) 20 MG tablet TAKE 1 TABLET AT BEDTIME 90 tablet 3   Magnesium 400 MG CAPS Take 1 tablet by mouth daily.      piroxicam (FELDENE) 20 MG capsule TAKE 1 CAPSULE BY MOUTH ONCE DAILY AS NEEDED FOR  PAIN. APPOINTMENT REQUIRED FOR FUTURE REFILLS. 30 capsule 0   Potassium Gluconate 595 MG CAPS Take 1 capsule by mouth daily.      No current facility-administered medications on file prior to visit.   Allergies  Allergen Reactions   Ace Inhibitors     Cough   Amlodipine     Swelling   Codeine     unknown   Crestor [Rosuvastatin]     unknown  Dramamine [Dimenhydrinate]     unknown   Lipitor [Atorvastatin]     Constipation   Quinine Derivatives     Hives   Sulfa Antibiotics     unknown   Social History   Socioeconomic History   Marital status: Married    Spouse name: Not on file   Number of children: Not on file   Years of education: Not on file   Highest education level: Not on file  Occupational History   Not on file  Tobacco Use   Smoking status: Never   Smokeless tobacco: Former    Types: Chew  Substance and Sexual Activity   Alcohol use: No   Drug use: No   Sexual activity: Yes  Other Topics Concern   Not on file  Social History Narrative   Not on file   Social Determinants of Health   Financial Resource Strain: Low Risk  (12/17/2021)   Overall Financial Resource Strain (CARDIA)    Difficulty of  Paying Living Expenses: Not hard at all  Food Insecurity: No Food Insecurity (12/17/2021)   Hunger Vital Sign    Worried About Running Out of Food in the Last Year: Never true    Ran Out of Food in the Last Year: Never true  Transportation Needs: No Transportation Needs (12/17/2021)   PRAPARE - Administrator, Civil Service (Medical): No    Lack of Transportation (Non-Medical): No  Physical Activity: Sufficiently Active (12/17/2021)   Exercise Vital Sign    Days of Exercise per Week: 5 days    Minutes of Exercise per Session: 40 min  Stress: No Stress Concern Present (12/17/2021)   [employer] of Occupational Health - Occupational Stress Questionnaire    Feeling of Stress : Only a little  Social Connections: Moderately Integrated (12/17/2021)   Social Connection and Isolation Panel [NHANES]    Frequency of Communication with Friends and Family: Once a week    Frequency of Social Gatherings with Friends and Family: Once a week    Attends Religious Services: 1 to 4 times per year    Active Member of [employer] or Organizations: Yes    Attends Banker Meetings: 1 to 4 times per year    Marital Status: Married  Catering manager Violence: Not At Risk (12/17/2021)   Humiliation, Afraid, Rape, and Kick questionnaire    Fear of Current or Ex-Partner: No    Emotionally Abused: No    Physically Abused: No    Sexually Abused: No   Family History  Problem Relation Age of Onset   Heart disease Mother    Diabetes Sister    Hypertension Sister    Hyperlipidemia Sister    Cancer Maternal Aunt        lung      Review of Systems  All other systems reviewed and are negative.      Objective:   Physical Exam Vitals reviewed.  Constitutional:      General: She is not in acute distress.    Appearance: She is well-developed. She is not ill-appearing, toxic-appearing or diaphoretic.  HENT:     Head: Normocephalic and atraumatic.     Right Ear: Tympanic membrane, ear  canal and external ear normal.     Left Ear: Tympanic membrane, ear canal and external ear normal.     Nose: Nose normal.      Mouth/Throat:     Pharynx: No oropharyngeal exudate.  Eyes:     General: No scleral  icterus.       Right eye: No discharge.        Left eye: No discharge.     Conjunctiva/sclera: Conjunctivae normal.     Pupils: Pupils are equal, round, and reactive to light.  Neck:     Thyroid: No thyromegaly.     Vascular: No JVD.     Trachea: No tracheal deviation.  Cardiovascular:     Rate and Rhythm: Normal rate and regular rhythm.     Heart sounds: Murmur heard.     No friction rub. No gallop.  Pulmonary:     Effort: Pulmonary effort is normal. No respiratory distress.     Breath sounds: No stridor. Rales present. No wheezing.  Chest:     Chest wall: No tenderness.    Abdominal:     General: Bowel sounds are normal. There is no distension.     Palpations: Abdomen is soft. There is no mass.     Tenderness: There is no abdominal tenderness. There is no guarding or rebound.  Musculoskeletal:        General: No tenderness. Normal range of motion.     Cervical back: Normal range of motion and neck supple.  Lymphadenopathy:     Cervical: No cervical adenopathy.  Skin:    General: Skin is warm.     Coloration: Skin is not pale.     Findings: No erythema or rash.  Neurological:     Mental Status: She is alert and oriented to person, place, and time.     Cranial Nerves: No cranial nerve deficit.     Motor: No abnormal muscle tone.     Coordination: Coordination normal.     Deep Tendon Reflexes: Reflexes are normal and symmetric.  Psychiatric:        Behavior: Behavior normal.        Thought Content: Thought content normal.        Judgment: Judgment normal.           Assessment & Plan:  Weakness - Plan: DG Chest 2 View, CBC with Differential/Platelet, COMPLETE METABOLIC PANEL WITH GFR, Urinalysis, Routine w reflex microscopic, Urine Culture By history,  the patient has waxing and waning levels of consciousness.  She is also having fevers at night.  Therefore I am concerned that she is suffering from delirium possibly due to an infection.  Her exam today is abnormal and that I hear crackles in the anterior right lung.  I will start the patient on Augmentin.  Meanwhile and obtain a chest x-ray to evaluate further given the atypical presentation.  Also get CBC CMP urinalysis and urine culture.  Recommended that they push fluids.  I also asked him to perform a COVID test at home as I do not have any rapid COVID test here in the clinic

## 2022-12-28 LAB — CBC WITH DIFFERENTIAL/PLATELET
Absolute Monocytes: 519 {cells}/uL (ref 200–950)
Basophils Absolute: 43 {cells}/uL (ref 0–200)
Basophils Relative: 0.7 %
Eosinophils Absolute: 140 {cells}/uL (ref 15–500)
Eosinophils Relative: 2.3 %
HCT: 32.1 % — ABNORMAL LOW (ref 35.0–45.0)
Hemoglobin: 10.3 g/dL — ABNORMAL LOW (ref 11.7–15.5)
Lymphs Abs: 1684 {cells}/uL (ref 850–3900)
MCH: 29.2 pg (ref 27.0–33.0)
MCHC: 32.1 g/dL (ref 32.0–36.0)
MCV: 90.9 fL (ref 80.0–100.0)
MPV: 10.2 fL (ref 7.5–12.5)
Monocytes Relative: 8.5 %
Neutro Abs: 3715 {cells}/uL (ref 1500–7800)
Neutrophils Relative %: 60.9 %
Platelets: 304 10*3/uL (ref 140–400)
RBC: 3.53 10*6/uL — ABNORMAL LOW (ref 3.80–5.10)
RDW: 13.2 % (ref 11.0–15.0)
Total Lymphocyte: 27.6 %
WBC: 6.1 10*3/uL (ref 3.8–10.8)

## 2022-12-28 LAB — URINE CULTURE
MICRO NUMBER:: 15536426
SPECIMEN QUALITY:: ADEQUATE

## 2022-12-28 LAB — COMPLETE METABOLIC PANEL WITH GFR
AG Ratio: 1.5 (calc) (ref 1.0–2.5)
ALT: 10 U/L (ref 6–29)
AST: 32 U/L (ref 10–35)
Albumin: 4 g/dL (ref 3.6–5.1)
Alkaline phosphatase (APISO): 60 U/L (ref 37–153)
BUN/Creatinine Ratio: 15 (calc) (ref 6–22)
BUN: 19 mg/dL (ref 7–25)
CO2: 28 mmol/L (ref 20–32)
Calcium: 9.1 mg/dL (ref 8.6–10.4)
Chloride: 106 mmol/L (ref 98–110)
Creat: 1.26 mg/dL — ABNORMAL HIGH (ref 0.60–0.95)
Globulin: 2.6 g/dL (ref 1.9–3.7)
Glucose, Bld: 117 mg/dL — ABNORMAL HIGH (ref 65–99)
Potassium: 3.7 mmol/L (ref 3.5–5.3)
Sodium: 142 mmol/L (ref 135–146)
Total Bilirubin: 0.5 mg/dL (ref 0.2–1.2)
Total Protein: 6.6 g/dL (ref 6.1–8.1)
eGFR: 43 mL/min/{1.73_m2} — ABNORMAL LOW (ref >=60)

## 2022-12-30 ENCOUNTER — Telehealth: Payer: Self-pay

## 2022-12-30 NOTE — Telephone Encounter (Signed)
Granddaughter called concerned about pt's memory issues getting worse. Pt's granddaughter stated that pt has been gradually showing signs of having some memory issues for over a year now, and would like to know if pt could get a cognitive test done. Pt's granddaughter informed that she will need to complete a DPR for pt so that she is able to speak with nurse/pcp about pt's care and getting this done. Please advise.   Cb#: Fernande Bras [phone]

## 2023-01-04 ENCOUNTER — Ambulatory Visit: Payer: Medicare HMO | Admitting: Family Medicine

## 2023-01-04 ENCOUNTER — Encounter: Payer: Self-pay | Admitting: Family Medicine

## 2023-01-04 VITALS — BP 132/80 | HR 77 | Temp 98.1°F | Ht 64.0 in | Wt 137.8 lb

## 2023-01-04 DIAGNOSIS — R413 Other amnesia: Secondary | ICD-10-CM

## 2023-01-04 NOTE — Progress Notes (Signed)
Subjective:    Patient ID: Julia Robinson, female    DOB: [birth year], 82 y.o.   MRN: [id number] 12/27/22 Patient states that Saturday she became extremely hot working outside and became extremely weak and had to stop and rest.  However her husband states that over the last week she has been more confused.  She is weak.  She is falling.  He states that she is running fevers at night although they are not checking the temperature.  She feels hot to the touch per his report.  They deny any cough, chest pain, shortness of breath.  They deny any nausea or vomiting or abdominal pain.  They deny any dysuria.  Cranial nerves II through XII are grossly intact with muscle strength 5/5 equal and symmetric in the upper and lower extremities.  The patient is walking normally up and down the hallway.  There is no obvious neurologic deficit.  Here today she appears to be at her baseline mental status.  She is answering questions appropriately.  She is laughing and conversing.  However the family states that at times she is confused.  She does not recall having conversations with her son.  He also is getting more confused at night.  This seems to have occurred over the last week when she has been having fevers at night.  He today is a marked septal crackles female back in shape anteriorly right chest.  I do not appreciate them posteriorly.  Otherwise her exam is normal.  At that time, my plan was: By history, the patient has waxing and waning levels of consciousness.  She is also having fevers at night.  Therefore I am concerned that she is suffering from delirium possibly due to an infection.  Her exam today is abnormal and that I hear crackles in the anterior right lung.  I will start the patient on Augmentin.  Meanwhile and obtain a chest x-ray to evaluate further given the atypical presentation.  Also get CBC CMP urinalysis and urine culture.  Recommended that they push fluids.  I also asked him to perform a COVID test at  home as I do not have any rapid COVID test here in the clinic  01/04/23 No visits with results within 1 Week(s) from this visit.  Latest known visit with results is:  Office Visit on 12/27/2022  Component Date Value Ref Range Status   WBC 12/27/2022 6.1  3.8 - 10.8 Thousand/uL Final   RBC 12/27/2022 3.53 (L)  3.80 - 5.10 Million/uL Final   Hemoglobin 12/27/2022 10.3 (L)  11.7 - 15.5 g/dL Final   HCT 40/98/1191 32.1 (L)  35.0 - 45.0 % Final   MCV 12/27/2022 90.9  80.0 - 100.0 fL Final   MCH 12/27/2022 29.2  27.0 - 33.0 pg Final   MCHC 12/27/2022 32.1  32.0 - 36.0 g/dL Final   Comment: For adults, a slight decrease in the calculated MCHC value (in the range of 30 to 32 g/dL) is most likely not clinically significant; however, it should be interpreted with caution in correlation with other red cell parameters and the patient's clinical condition.    RDW 12/27/2022 13.2  11.0 - 15.0 % Final   Platelets 12/27/2022 304  140 - 400 Thousand/uL Final   MPV 12/27/2022 10.2  7.5 - 12.5 fL Final   Neutro Abs 12/27/2022 3,715  1,500 - 7,800 cells/uL Final   Lymphs Abs 12/27/2022 1,684  850 - 3,900 cells/uL Final   Absolute Monocytes 12/27/2022 519  200 - 950 cells/uL Final   Eosinophils Absolute 12/27/2022 140  15 - 500 cells/uL Final   Basophils Absolute 12/27/2022 43  0 - 200 cells/uL Final   Neutrophils Relative % 12/27/2022 60.9  % Final   Total Lymphocyte 12/27/2022 27.6  % Final   Monocytes Relative 12/27/2022 8.5  % Final   Eosinophils Relative 12/27/2022 2.3  % Final   Basophils Relative 12/27/2022 0.7  % Final   Glucose, Bld 12/27/2022 117 (H)  65 - 99 mg/dL Final   Comment: .            Fasting reference interval . For someone without known diabetes, a glucose value between 100 and 125 mg/dL is consistent with prediabetes and should be confirmed with a follow-up test. .    BUN 12/27/2022 19  7 - 25 mg/dL Final   Creat 11/91/4782 1.26 (H)  0.60 - 0.95 mg/dL Final   eGFR  95/62/1308 43 (L)  > OR = 60 mL/min/1.68m2 Final   BUN/Creatinine Ratio 12/27/2022 15  6 - 22 (calc) Final   Sodium 12/27/2022 142  135 - 146 mmol/L Final   Potassium 12/27/2022 3.7  3.5 - 5.3 mmol/L Final   Chloride 12/27/2022 106  98 - 110 mmol/L Final   CO2 12/27/2022 28  20 - 32 mmol/L Final   Calcium 12/27/2022 9.1  8.6 - 10.4 mg/dL Final   Total Protein 65/78/4696 6.6  6.1 - 8.1 g/dL Final   Albumin 29/52/8413 4.0  3.6 - 5.1 g/dL Final   Globulin 24/40/1027 2.6  1.9 - 3.7 g/dL (calc) Final   AG Ratio 12/27/2022 1.5  1.0 - 2.5 (calc) Final   Total Bilirubin 12/27/2022 0.5  0.2 - 1.2 mg/dL Final   Alkaline phosphatase (APISO) 12/27/2022 60  37 - 153 U/L Final   AST 12/27/2022 32  10 - 35 U/L Final   ALT 12/27/2022 10  6 - 29 U/L Final   Color, Urine 12/27/2022 YELLOW  YELLOW Final   APPearance 12/27/2022 CLEAR  CLEAR Final   Specific Gravity, Urine 12/27/2022 1.025  1.001 - 1.035 Final   pH 12/27/2022 6.0  5.0 - 8.0 Final   Glucose, UA 12/27/2022 NEGATIVE  NEGATIVE Final   Bilirubin Urine 12/27/2022 NEGATIVE  NEGATIVE Final   Ketones, ur 12/27/2022 TRACE (A)  NEGATIVE Final   Hgb urine dipstick 12/27/2022 TRACE (A)  NEGATIVE Final   Protein, ur 12/27/2022 1+ (A)  NEGATIVE Final   Nitrite 12/27/2022 NEGATIVE  NEGATIVE Final   Leukocytes,Ua 12/27/2022 NEGATIVE  NEGATIVE Final   WBC, UA 12/27/2022 0-5  0 - 5 /HPF Final   RBC / HPF 12/27/2022 0-2  0 - 2 /HPF Final   Squamous Epithelial / HPF 12/27/2022 0-5  < OR = 5 /HPF Final   Bacteria, UA 12/27/2022 NONE SEEN  NONE SEEN /HPF Final   Hyaline Cast 12/27/2022 NONE SEEN  NONE SEEN /LPF Final   MICRO NUMBER: 12/27/2022 [id number]   Final   SPECIMEN QUALITY: 12/27/2022 Adequate   Final   Sample Source 12/27/2022 URINE   Final   STATUS: 12/27/2022 FINAL   Final   Result: 12/27/2022 Less than 10,000 CFU/mL of single Gram negative organism isolated. No further testing will be performed. If clinically indicated, recollection using a method  to minimize contamination, with prompt transfer to Urine Culture Transport Tube, is recommended.   Final   Note 12/27/2022    Final   Comment: This urine was analyzed for the presence of WBC,  RBC, bacteria, casts, and other formed elements.  Only those elements seen were reported. . .    Covid test negative and CXR was unremarkable.  Family states that her memory has improved over the last week after taking antibiotics.  For instance, prior to the antibiotics, her son states that he called her on the telephone and she had no recollection of conversation.  Her husband feels that her baseline.  That being said, and they have noticed some mild memory problems over the last 6 months.  I performed a Mini-Mental status exam today.  The patient tells me that his February 04, 2023.  I asked her what holiday is in this month and she correctly said Halloween.  However she was unable to tell me that it was October.  She was able to get the location properly.  She was unable to remember any of 3 objects on recall.  She had a difficult time spelling world in reverse but after 3 chance that she was able to spell it however she reversed O and R.  Patient would not attempt serial sevens despite being encouraged to do so.  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  She states that she always had a difficult time with math.  She did well on the clock drawing.  She also did well on pattern recognition.  Therefore the patient scored 24 out of 30 however some of this may be affected by her baseline educational status.  I do believe that there is some mild short-term memory issues. Past Medical History:  Diagnosis Date   Diverticulosis    High cholesterol    Hyperlipidemia    Hypertension    Moderate aortic regurgitation    with mild aortic stenosis echo 10/20   Osteoporosis    Prolapsed uterus    Past Surgical History:  Procedure Laterality Date   ABDOMINAL HYSTERECTOMY     A+P  repair 2007   BACK SURGERY     CLOSED REDUCTION FINGER WITH PERCUTANEOUS PINNING Left 09/10/2015   Procedure: CLOSED REDUCTION FINGER WITH PERCUTANEOUS PINNING;  Surgeon: Betha Loa, MD;  Location: [facility] OR;  Service: Orthopedics;  Laterality: Left;   I & D EXTREMITY Left 09/10/2015   Procedure: IRRIGATION AND DEBRIDEMENT THUMB, REPAIR OF SKIN AND NAIL BED;  Surgeon: Betha Loa, MD;  Location: [facility] OR;  Service: Orthopedics;  Laterality: Left;   Current Outpatient Medications on File Prior to Visit  Medication Sig Dispense Refill   amoxicillin-clavulanate (AUGMENTIN) 875-125 MG tablet Take 1 tablet by mouth 2 (two) times daily. 20 tablet 0   aspirin 81 MG tablet Take 81 mg by mouth daily.     Calcium Carbonate-Vitamin D3 600-400 MG-UNIT TABS Take by mouth.     Cyanocobalamin 1000 MCG CAPS Take 1 capsule by mouth daily.     doxazosin (CARDURA) 2 MG tablet Take 1 tablet (2 mg total) by mouth daily. 90 tablet 3   lovastatin (MEVACOR) 20 MG tablet TAKE 1 TABLET AT BEDTIME 90 tablet 3   Magnesium 400 MG CAPS Take 1 tablet by mouth daily.      piroxicam (FELDENE) 20 MG capsule TAKE 1 CAPSULE BY MOUTH ONCE DAILY AS NEEDED FOR  PAIN. APPOINTMENT REQUIRED FOR FUTURE REFILLS. 30 capsule 0   Potassium Gluconate 595 MG CAPS Take 1 capsule by mouth daily.      No current facility-administered medications on file prior to visit.   Allergies  Allergen Reactions   Ace Inhibitors  Cough   Amlodipine     Swelling   Codeine     unknown   Crestor [Rosuvastatin]     unknown   Dramamine [Dimenhydrinate]     unknown   Lipitor [Atorvastatin]     Constipation   Quinine Derivatives     Hives   Sulfa Antibiotics     unknown   Social History   Socioeconomic History   Marital status: Married    Spouse name: Not on file   Number of children: Not on file   Years of education: Not on file   Highest education level: Not on file  Occupational History   Not on file  Tobacco Use   Smoking status: Never    Smokeless tobacco: Former    Types: Chew  Substance and Sexual Activity   Alcohol use: No   Drug use: No   Sexual activity: Yes  Other Topics Concern   Not on file  Social History Narrative   Not on file   Social Determinants of Health   Financial Resource Strain: Low Risk  (12/17/2021)   Overall Financial Resource Strain (CARDIA)    Difficulty of Paying Living Expenses: Not hard at all  Food Insecurity: No Food Insecurity (12/17/2021)   Hunger Vital Sign    Worried About Running Out of Food in the Last Year: Never true    Ran Out of Food in the Last Year: Never true  Transportation Needs: No Transportation Needs (12/17/2021)   PRAPARE - Administrator, Civil Service (Medical): No    Lack of Transportation (Non-Medical): No  Physical Activity: Sufficiently Active (12/17/2021)   Exercise Vital Sign    Days of Exercise per Week: 5 days    Minutes of Exercise per Session: 40 min  Stress: No Stress Concern Present (12/17/2021)   [employer] of Occupational Health - Occupational Stress Questionnaire    Feeling of Stress : Only a little  Social Connections: Moderately Integrated (12/17/2021)   Social Connection and Isolation Panel [NHANES]    Frequency of Communication with Friends and Family: Once a week    Frequency of Social Gatherings with Friends and Family: Once a week    Attends Religious Services: 1 to 4 times per year    Active Member of [employer] or Organizations: Yes    Attends Banker Meetings: 1 to 4 times per year    Marital Status: Married  Catering manager Violence: Not At Risk (12/17/2021)   Humiliation, Afraid, Rape, and Kick questionnaire    Fear of Current or Ex-Partner: No    Emotionally Abused: No    Physically Abused: No    Sexually Abused: No   Family History  Problem Relation Age of Onset   Heart disease Mother    Diabetes Sister    Hypertension Sister    Hyperlipidemia Sister    Cancer Maternal Aunt        lung       Review of Systems  All other systems reviewed and are negative.      Objective:   Physical Exam Vitals reviewed.  Constitutional:      General: She is not in acute distress.    Appearance: She is well-developed. She is not ill-appearing, toxic-appearing or diaphoretic.  HENT:     Head: Normocephalic and atraumatic.     Right Ear: Tympanic membrane, ear canal and external ear normal.     Left Ear: Tympanic membrane, ear canal and external ear normal.  Nose: Nose normal.      Mouth/Throat:     Pharynx: No oropharyngeal exudate.  Eyes:     General: No scleral icterus.       Right eye: No discharge.        Left eye: No discharge.     Conjunctiva/sclera: Conjunctivae normal.     Pupils: Pupils are equal, round, and reactive to light.  Neck:     Thyroid: No thyromegaly.     Vascular: No JVD.     Trachea: No tracheal deviation.  Cardiovascular:     Rate and Rhythm: Normal rate and regular rhythm.     Heart sounds: Murmur heard.     No friction rub. No gallop.  Pulmonary:     Effort: Pulmonary effort is normal. No respiratory distress.     Breath sounds: No stridor. No wheezing or rales.  Chest:     Chest wall: No tenderness.  Abdominal:     General: Bowel sounds are normal. There is no distension.     Palpations: Abdomen is soft. There is no mass.     Tenderness: There is no abdominal tenderness. There is no guarding or rebound.  Musculoskeletal:        General: No tenderness. Normal range of motion.     Cervical back: Normal range of motion and neck supple.  Lymphadenopathy:     Cervical: No cervical adenopathy.  Skin:    General: Skin is warm.     Coloration: Skin is not pale.     Findings: No erythema or rash.  Neurological:     Mental Status: She is alert and oriented to person, place, and time.     Cranial Nerves: No cranial nerve deficit.     Motor: No abnormal muscle tone.     Coordination: Coordination normal.     Deep Tendon Reflexes: Reflexes  are normal and symmetric.  Psychiatric:        Behavior: Behavior normal.        Thought Content: Thought content normal.        Judgment: Judgment normal.           Assessment & Plan:  Memory loss - Plan: Vitamin B12, Iron, TSH Patient is approaching her baseline.  I believe that she was dealing with delirium due to most likely and possibly pneumonia.  She seems to have recovered after taking Augmentin.  However I believe that based on her Mini-Mental status exam, there is some underlying short-term memory loss.  At the present time it is mild.  I recommended to the family that we monitor the situation.  If the memory loss is progressive, we could consider adding Aricept at her next visit.  If stable, the patient may have some mild underlying vascular dementia and we may simply monitor.  I will check a vitamin B12 level, TSH, and an iron level due to her anemia

## 2023-01-05 LAB — IRON: Iron: 50 ug/dL (ref 45–160)

## 2023-01-05 LAB — TSH: TSH: 4.98 m[IU]/L — ABNORMAL HIGH (ref 0.40–4.50)

## 2023-01-05 LAB — VITAMIN B12: Vitamin B-12: 1909 pg/mL — ABNORMAL HIGH (ref 200–1100)

## 2023-01-10 ENCOUNTER — Other Ambulatory Visit: Payer: Self-pay

## 2023-01-10 MED ORDER — IRON (FERROUS SULFATE) 325 (65 FE) MG PO TABS: 325.0000 mg | ORAL_TABLET | Freq: Every day | ORAL | 3 refills | Status: AC

## 2023-01-19 ENCOUNTER — Ambulatory Visit (INDEPENDENT_AMBULATORY_CARE_PROVIDER_SITE_OTHER): Payer: Medicare HMO

## 2023-01-19 VITALS — Ht 64.0 in | Wt 137.0 lb

## 2023-01-19 DIAGNOSIS — Z0000 Encounter for general adult medical examination without abnormal findings: Secondary | ICD-10-CM | POA: Diagnosis not present

## 2023-01-19 NOTE — Progress Notes (Signed)
Subjective:   Julia Robinson is a 82 y.o. female who presents for Medicare Annual (Subsequent) preventive examination.  Visit Complete: Virtual I connected with  Everardo Beals on 01/19/23 by a audio enabled telemedicine application and verified that I am speaking with the correct person using two identifiers.  Patient Location: Home  Provider Location: Home Office  I discussed the limitations of evaluation and management by telemedicine. The patient expressed understanding and agreed to proceed.  Vital Signs: Because this visit was a virtual/telehealth visit, some criteria may be missing or patient reported. Any vitals not documented were not able to be obtained and vitals that have been documented are patient reported.  Cardiac Risk Factors include: advanced age (>6men, >67 women);hypertension;dyslipidemia     Objective:    Today's Vitals   01/19/23 1253  Weight: 137 lb (62.1 kg)  Height: 5\' 4"  (1.626 m)   Body mass index is 23.52 kg/m.     01/19/2023   12:58 PM 12/17/2021    3:57 PM 11/14/2019    9:37 AM 09/10/2015    5:54 PM  Advanced Directives  Does Patient Have a Medical Advance Directive? No No Yes Yes  Type of Surveyor, minerals;Living will Healthcare Power of [place];Living will  Would patient like information on creating a medical advance directive? Yes (MAU/Ambulatory/Procedural Areas - Information given)       Current Medications (verified) Outpatient Encounter Medications as of 01/19/2023  Medication Sig   aspirin 81 MG tablet Take 81 mg by mouth daily.   Calcium Carbonate-Vitamin D3 600-400 MG-UNIT TABS Take by mouth.   Cyanocobalamin 1000 MCG CAPS Take 1 capsule by mouth daily.   doxazosin (CARDURA) 2 MG tablet Take 1 tablet (2 mg total) by mouth daily.   Iron, Ferrous Sulfate, 325 (65 Fe) MG TABS Take 325 mg by mouth daily.   lovastatin (MEVACOR) 20 MG tablet TAKE 1 TABLET AT BEDTIME   Magnesium 400 MG CAPS Take 1  tablet by mouth daily.    piroxicam (FELDENE) 20 MG capsule TAKE 1 CAPSULE BY MOUTH ONCE DAILY AS NEEDED FOR  PAIN. APPOINTMENT REQUIRED FOR FUTURE REFILLS.   Potassium Gluconate 595 MG CAPS Take 1 capsule by mouth daily.    [DISCONTINUED] amoxicillin-clavulanate (AUGMENTIN) 875-125 MG tablet Take 1 tablet by mouth 2 (two) times daily.   No facility-administered encounter medications on file as of 01/19/2023.    Allergies (verified) Ace inhibitors, Amlodipine, Codeine, Crestor [rosuvastatin], Dramamine [dimenhydrinate], Lipitor [atorvastatin], Quinine derivatives, and Sulfa antibiotics   History: Past Medical History:  Diagnosis Date   Diverticulosis    High cholesterol    Hyperlipidemia    Hypertension    Moderate aortic regurgitation    with mild aortic stenosis echo 10/20   Osteoporosis    Prolapsed uterus    Past Surgical History:  Procedure Laterality Date   ABDOMINAL HYSTERECTOMY     A+P repair 2007   BACK SURGERY     CLOSED REDUCTION FINGER WITH PERCUTANEOUS PINNING Left 09/10/2015   Procedure: CLOSED REDUCTION FINGER WITH PERCUTANEOUS PINNING;  Surgeon: Betha Loa, MD;  Location: [facility] OR;  Service: Orthopedics;  Laterality: Left;   I & D EXTREMITY Left 09/10/2015   Procedure: IRRIGATION AND DEBRIDEMENT THUMB, REPAIR OF SKIN AND NAIL BED;  Surgeon: Betha Loa, MD;  Location: [facility] OR;  Service: Orthopedics;  Laterality: Left;   Family History  Problem Relation Age of Onset   Heart disease Mother    Diabetes Sister  Hypertension Sister    Hyperlipidemia Sister    Cancer Maternal Aunt        lung   Social History   Socioeconomic History   Marital status: Married    Spouse name: Not on file   Number of children: Not on file   Years of education: Not on file   Highest education level: Not on file  Occupational History   Not on file  Tobacco Use   Smoking status: Never   Smokeless tobacco: Former    Types: Chew  Substance and Sexual Activity   Alcohol use: No    Drug use: No   Sexual activity: Yes  Other Topics Concern   Not on file  Social History Narrative   Not on file   Social Determinants of Health   Financial Resource Strain: Low Risk  (01/19/2023)   Overall Financial Resource Strain (CARDIA)    Difficulty of Paying Living Expenses: Not hard at all  Food Insecurity: No Food Insecurity (01/19/2023)   Hunger Vital Sign    Worried About Running Out of Food in the Last Year: Never true    Ran Out of Food in the Last Year: Never true  Transportation Needs: No Transportation Needs (01/19/2023)   PRAPARE - Administrator, Civil Service (Medical): No    Lack of Transportation (Non-Medical): No  Physical Activity: Sufficiently Active (01/19/2023)   Exercise Vital Sign    Days of Exercise per Week: 5 days    Minutes of Exercise per Session: 30 min  Stress: No Stress Concern Present (01/19/2023)   [employer] of Occupational Health - Occupational Stress Questionnaire    Feeling of Stress : Not at all  Social Connections: Socially Integrated (01/19/2023)   Social Connection and Isolation Panel [NHANES]    Frequency of Communication with Friends and Family: More than three times a week    Frequency of Social Gatherings with Friends and Family: Three times a week    Attends Religious Services: 1 to 4 times per year    Active Member of Clubs or Organizations: Yes    Attends Banker Meetings: 1 to 4 times per year    Marital Status: Married    Tobacco Counseling Counseling given: Not Answered   Clinical Intake:  Pre-visit preparation completed: Yes  Pain : No/denies pain     Diabetes: No  How often do you need to have someone help you when you read instructions, pamphlets, or other written materials from your doctor or pharmacy?: 1 - Never  Interpreter Needed?: No  Information entered by :: Kandis Fantasia LPN   Activities of Daily Living    01/19/2023   12:58 PM  In your present state of  health, do you have any difficulty performing the following activities:  Hearing? 0  Vision? 0  Difficulty concentrating or making decisions? 0  Walking or climbing stairs? 0  Dressing or bathing? 0  Doing errands, shopping? 0  Preparing Food and eating ? N  Using the Toilet? N  In the past six months, have you accidently leaked urine? N  Do you have problems with loss of bowel control? N  Managing your Medications? N  Managing your Finances? N  Housekeeping or managing your Housekeeping? N    Patient Care Team: [name], MD as PCP - General (Family Medicine) West Bali, MD (Inactive) as Consulting Physician (Gastroenterology) Erroll Luna, [facility] (Inactive) as Pharmacist (Pharmacist)  Indicate any recent Medical Services you  may have received from other than [facility] providers in the past year (date may be approximate).     Assessment:   This is a routine wellness examination for Julia Robinson.  Hearing/Vision screen Hearing Screening - Comments:: Denies hearing difficulties   Vision Screening - Comments:: No vision problems; will schedule routine eye exam soon     Goals Addressed             This Visit's Progress    Remain active and independent        Depression Screen    01/19/2023   12:57 PM 01/04/2023    2:14 PM 10/28/2022    8:11 AM 04/22/2022    8:48 AM 12/17/2021    3:41 PM 01/21/2021    8:08 AM 10/08/2020    8:19 AM  PHQ 2/9 Scores  PHQ - 2 Score 0 0 0 0 0 0 0    Fall Risk    01/19/2023   12:58 PM 01/04/2023    2:14 PM 10/28/2022    8:11 AM 04/22/2022    8:48 AM 01/21/2021    8:08 AM  Fall Risk   Falls in the past year? 0 0 0 0 1  Number falls in past yr: 0 0 0 0 0  Injury with Fall? 0 0 0 0 0  Risk for fall due to : No Fall Risks No Fall Risks No Fall Risks No Fall Risks No Fall Risks  Follow up Falls prevention discussed;Education provided;Falls evaluation completed Falls prevention discussed Falls prevention discussed Falls prevention  discussed Falls evaluation completed    MEDICARE RISK AT HOME: Medicare Risk at Home Any stairs in or around the home?: No If so, are there any without handrails?: No Home free of loose throw rugs in walkways, pet beds, electrical cords, etc?: Yes Adequate lighting in your home to reduce risk of falls?: Yes Life alert?: No Use of a cane, walker or w/c?: No Grab bars in the bathroom?: Yes Shower chair or bench in shower?: No Elevated toilet seat or a handicapped toilet?: Yes  TIMED UP AND GO:  Was the test performed?  No    Cognitive Function:        01/19/2023   12:58 PM 11/14/2019    9:36 AM  6CIT Screen  What Year? 0 points 0 points  What month? 0 points 0 points  What time? 0 points 0 points  Count back from 20 0 points 0 points  Months in reverse 0 points 0 points  Repeat phrase 0 points 0 points  Total Score 0 points 0 points    Immunizations Immunization History  Administered Date(s) Administered   Fluad Quad(high Dose 65+) 12/25/2018   Influenza, High Dose Seasonal PF 01/18/2018   Influenza,inj,Quad PF,6+ Mos 01/08/2013, 12/25/2013, 12/16/2014, 12/17/2015, 12/27/2016   Pneumococcal Conjugate-13 09/18/2014   Pneumococcal Polysaccharide-23 12/28/2007, 12/25/2013   Td 12/27/1998   Tdap 09/10/2015    TDAP status: Up to date  Flu Vaccine status: Declined, Education has been provided regarding the importance of this vaccine but patient still declined. Advised may receive this vaccine at local pharmacy or Health Dept. Aware to provide a copy of the vaccination record if obtained from local pharmacy or Health Dept. Verbalized acceptance and understanding.  Pneumococcal vaccine status: Up to date  Covid-19 vaccine status: Information provided on how to obtain vaccines.   Qualifies for Shingles Vaccine? Yes   Zostavax completed No   Shingrix Completed?: No.    Education has been provided regarding the  importance of this vaccine. Patient has been advised to call  insurance company to determine out of pocket expense if they have not yet received this vaccine. Advised may also receive vaccine at local pharmacy or Health Dept. Verbalized acceptance and understanding.  Screening Tests Health Maintenance  Topic Date Due   Zoster Vaccines- Shingrix (1 of 2) 01/28/2023 (Originally [birth year]/1992)   INFLUENZA VACCINE  06/26/2023 (Originally 10/27/2022)   Medicare Annual Wellness (AWV)  01/19/2024   DTaP/Tdap/Td (3 - Td or Tdap) 09/09/2025   Pneumonia Vaccine 58+ Years old  Completed   DEXA SCAN  Completed   HPV VACCINES  Aged Out   Colonoscopy  Discontinued   COVID-19 Vaccine  Discontinued    Health Maintenance  There are no preventive care reminders to display for this patient.  Colorectal cancer screening: No longer required.   Mammogram status: No longer required due to age and preference .  Bone Density status: Completed 01/26/21. Results reflect: Bone density results: OSTEOPOROSIS. Repeat every 2 years.  Lung Cancer Screening: (Low Dose CT Chest recommended if Age 13-80 years, 20 pack-year currently smoking OR have quit w/in 15years.) does not qualify.   Lung Cancer Screening Referral: n/a  Additional Screening:  Hepatitis C Screening: does not qualify  Vision Screening: Recommended annual ophthalmology exams for early detection of glaucoma and other disorders of the eye. Is the patient up to date with their annual eye exam?  No  Who is the provider or what is the name of the office in which the patient attends annual eye exams? none If pt is not established with a provider, would they like to be referred to a provider to establish care? No .   Dental Screening: Recommended annual dental exams for proper oral hygiene  Community Resource Referral / Chronic Care Management: CRR required this visit?  No   CCM required this visit?  No     Plan:     I have personally reviewed and noted the following in the patient's chart:   Medical and  social history Use of alcohol, tobacco or illicit drugs  Current medications and supplements including opioid prescriptions. Patient is not currently taking opioid prescriptions. Functional ability and status Nutritional status Physical activity Advanced directives List of other physicians Hospitalizations, surgeries, and ER visits in previous 12 months Vitals Screenings to include cognitive, depression, and falls Referrals and appointments  In addition, I have reviewed and discussed with patient certain preventive protocols, quality metrics, and best practice recommendations. A written personalized care plan for preventive services as well as general preventive health recommendations were provided to patient.     Kandis Fantasia [place], [place]   09/81/1912   After Visit Summary: (Mail) Due to this being a telephonic visit, the after visit summary with patients personalized plan was offered to patient via mail   Nurse Notes: No concerns at this time

## 2023-01-19 NOTE — Patient Instructions (Signed)
Julia Robinson , Thank you for taking time to come for your Medicare Wellness Visit. I appreciate your ongoing commitment to your health goals. Please review the following plan we discussed and let me know if I can assist you in the future.   Referrals/Orders/Follow-Ups/Clinician Recommendations: Aim for 30 minutes of exercise or brisk walking, 6-8 glasses of water, and 5 servings of fruits and vegetables each day.  This is a list of the screening recommended for you and due dates:  Health Maintenance  Topic Date Due   Zoster (Shingles) Vaccine (1 of 2) 01/28/2023*   Flu Shot  06/26/2023*   Medicare Annual Wellness Visit  01/19/2024   DTaP/Tdap/Td vaccine (3 - Td or Tdap) 09/09/2025   Pneumonia Vaccine  Completed   DEXA scan (bone density measurement)  Completed   HPV Vaccine  Aged Out   Colon Cancer Screening  Discontinued   COVID-19 Vaccine  Discontinued  *Topic was postponed. The date shown is not the original due date.    Advanced directives: (ACP Link)Information on Advanced Care Planning can be found at [facility] [place] Advance Health Care Directives Advance Health Care Directives ([email or web address]   Next Medicare Annual Wellness Visit scheduled for next year: Yes

## 2023-03-27 ENCOUNTER — Ambulatory Visit: Payer: Medicare HMO | Admitting: Family Medicine

## 2023-03-27 ENCOUNTER — Encounter (INDEPENDENT_AMBULATORY_CARE_PROVIDER_SITE_OTHER): Payer: Medicare HMO | Admitting: Family Medicine

## 2023-03-27 ENCOUNTER — Encounter: Payer: Self-pay | Admitting: Family Medicine

## 2023-03-27 ENCOUNTER — Ambulatory Visit (INDEPENDENT_AMBULATORY_CARE_PROVIDER_SITE_OTHER): Payer: Medicare HMO | Admitting: Family Medicine

## 2023-03-27 VITALS — BP 160/62 | HR 77 | Temp 98.7°F | Ht 64.0 in | Wt 125.0 lb

## 2023-03-27 DIAGNOSIS — J069 Acute upper respiratory infection, unspecified: Secondary | ICD-10-CM | POA: Insufficient documentation

## 2023-03-27 MED ORDER — BENZONATATE 200 MG PO CAPS: 200.0000 mg | ORAL_CAPSULE | Freq: Two times a day (BID) | ORAL | 0 refills | Status: AC | PRN

## 2023-03-27 NOTE — Assessment & Plan Note (Addendum)
Symptoms consistent with viral URI. Patient declined flu/covid/RSV testing today. BP elevated, encouraged to monitor at home and follow up with PCP when no longer ill. Reassured patient that symptoms and exam findings are most consistent with a viral upper respiratory infection and explained lack of efficacy of antibiotics against viruses.  Discussed expected course and features suggestive of secondary bacterial infection.  Continue supportive care. Increase fluid intake with water or electrolyte solution like pedialyte. Encouraged acetaminophen as needed for fever/pain. Encouraged salt water gargling, chloraseptic spray and throat lozenges. Encouraged OTC guaifenesin. Encouraged saline sinus flushes and/or neti with humidified air. Tessalon PRN for cough.

## 2023-03-27 NOTE — Progress Notes (Signed)
Subjective:  HPI: Julia Robinson is a 82 y.o. female presenting on 03/27/2023 for No chief complaint on file.   HPI Patient is in today for 4 days of mild cough and congestion with postnasal drip. Denies fever, chills, body aches, shortness of breath, pleurisy Has been exposed to her husband who is also sick. No home covid test, Has tried Alka Seltzer cold and flu and Coricidin.  Review of Systems  All other systems reviewed and are negative.   Relevant past medical history reviewed and updated as indicated.   Past Medical History:  Diagnosis Date   Diverticulosis    High cholesterol    Hyperlipidemia    Hypertension    Moderate aortic regurgitation    with mild aortic stenosis echo 10/20   Osteoporosis    Prolapsed uterus      Past Surgical History:  Procedure Laterality Date   ABDOMINAL HYSTERECTOMY     A+P repair 2007   BACK SURGERY     CLOSED REDUCTION FINGER WITH PERCUTANEOUS PINNING Left 09/10/2015   Procedure: CLOSED REDUCTION FINGER WITH PERCUTANEOUS PINNING;  Surgeon: Betha Loa, MD;  Location: [facility] OR;  Service: Orthopedics;  Laterality: Left;   I & D EXTREMITY Left 09/10/2015   Procedure: IRRIGATION AND DEBRIDEMENT THUMB, REPAIR OF SKIN AND NAIL BED;  Surgeon: Betha Loa, MD;  Location: [facility] OR;  Service: Orthopedics;  Laterality: Left;    Allergies and medications reviewed and updated.   Current Outpatient Medications:    benzonatate (TESSALON) 200 MG capsule, Take 1 capsule (200 mg total) by mouth 2 (two) times daily as needed for cough., Disp: 20 capsule, Rfl: 0   aspirin 81 MG tablet, Take 81 mg by mouth daily., Disp: , Rfl:    Calcium Carbonate-Vitamin D3 600-400 MG-UNIT TABS, Take by mouth., Disp: , Rfl:    Cyanocobalamin 1000 MCG CAPS, Take 1 capsule by mouth daily., Disp: , Rfl:    doxazosin (CARDURA) 2 MG tablet, Take 1 tablet (2 mg total) by mouth daily., Disp: 90 tablet, Rfl: 3   Iron, Ferrous Sulfate, 325 (65 Fe) MG TABS, Take 325 mg by  mouth daily., Disp: 60 tablet, Rfl: 3   lovastatin (MEVACOR) 20 MG tablet, TAKE 1 TABLET AT BEDTIME, Disp: 90 tablet, Rfl: 3   Magnesium 400 MG CAPS, Take 1 tablet by mouth daily. , Disp: , Rfl:    piroxicam (FELDENE) 20 MG capsule, TAKE 1 CAPSULE BY MOUTH ONCE DAILY AS NEEDED FOR  PAIN. APPOINTMENT REQUIRED FOR FUTURE REFILLS., Disp: 30 capsule, Rfl: 0   Potassium Gluconate 595 MG CAPS, Take 1 capsule by mouth daily. , Disp: , Rfl:   Allergies  Allergen Reactions   Ace Inhibitors     Cough   Amlodipine     Swelling   Codeine     unknown   Crestor [Rosuvastatin]     unknown   Dramamine [Dimenhydrinate]     unknown   Lipitor [Atorvastatin]     Constipation   Quinine Derivatives     Hives   Sulfa Antibiotics     unknown    Objective:   BP (!) 160/62   Pulse 77   Temp 98.7 F (37.1 C) (Oral)   Ht 5\' 4"  (1.626 m)   Wt 125 lb (56.7 kg)   SpO2 94%   BMI 21.46 kg/m      03/27/2023    9:20 AM 03/27/2023    8:20 AM 01/19/2023   12:53 PM  Vitals with BMI  Height 5\' 4"  5\' 4"  5\' 4"   Weight 125 lbs 125 lbs 137 lbs  BMI 21.45 21.45 23.5  Systolic 160 160 --  Diastolic 62 62 --  Pulse 77 77      Physical Exam Vitals and nursing note reviewed.  Constitutional:      Appearance: Normal appearance. She is normal weight.  HENT:     Head: Normocephalic and atraumatic.     Right Ear: Tympanic membrane, ear canal and external ear normal.     Left Ear: Tympanic membrane, ear canal and external ear normal.     Nose: Congestion present.     Mouth/Throat:     Mouth: Mucous membranes are moist.     Pharynx: Oropharynx is clear.  Eyes:     Conjunctiva/sclera:     Right eye: Right conjunctiva is injected.     Left eye: Left conjunctiva is injected.  Cardiovascular:     Rate and Rhythm: Normal rate and regular rhythm.     Pulses: Normal pulses.     Heart sounds: Normal heart sounds.  Pulmonary:     Effort: Pulmonary effort is normal.     Breath sounds: Normal breath sounds.   Musculoskeletal:     Cervical back: No tenderness.  Lymphadenopathy:     Cervical: No cervical adenopathy.  Skin:    General: Skin is warm and dry.  Neurological:     General: No focal deficit present.     Mental Status: She is alert and oriented to person, place, and time. Mental status is at baseline.  Psychiatric:        Mood and Affect: Mood normal.        Behavior: Behavior normal.        Thought Content: Thought content normal.        Judgment: Judgment normal.     Assessment & Plan:  Viral URI with cough Assessment & Plan: Symptoms consistent with viral URI. Patient declined flu/covid/RSV testing today. BP elevated, encouraged to monitor at home and follow up with PCP when no longer ill. Reassured patient that symptoms and exam findings are most consistent with a viral upper respiratory infection and explained lack of efficacy of antibiotics against viruses.  Discussed expected course and features suggestive of secondary bacterial infection.  Continue supportive care. Increase fluid intake with water or electrolyte solution like pedialyte. Encouraged acetaminophen as needed for fever/pain. Encouraged salt water gargling, chloraseptic spray and throat lozenges. Encouraged OTC guaifenesin. Encouraged saline sinus flushes and/or neti with humidified air. Tessalon PRN for cough.   Other orders -     Benzonatate; Take 1 capsule (200 mg total) by mouth 2 (two) times daily as needed for cough.  Dispense: 20 capsule; Refill: 0     Follow up plan: Return if symptoms worsen or fail to improve.  Park Meo, FNP

## 2023-03-28 NOTE — Progress Notes (Signed)
erroneous

## 2023-03-30 ENCOUNTER — Other Ambulatory Visit: Payer: Self-pay | Admitting: Family Medicine

## 2023-03-30 ENCOUNTER — Ambulatory Visit: Payer: Self-pay

## 2023-03-30 MED ORDER — AZITHROMYCIN 250 MG PO TABS: ORAL_TABLET | ORAL | 0 refills | Status: AC

## 2023-05-08 ENCOUNTER — Ambulatory Visit (INDEPENDENT_AMBULATORY_CARE_PROVIDER_SITE_OTHER): Payer: Medicare Other | Admitting: Family Medicine

## 2023-05-08 ENCOUNTER — Encounter: Payer: Self-pay | Admitting: Family Medicine

## 2023-05-08 VITALS — BP 130/70 | HR 62 | Ht 64.0 in | Wt 124.6 lb

## 2023-05-08 DIAGNOSIS — Z0001 Encounter for general adult medical examination with abnormal findings: Secondary | ICD-10-CM

## 2023-05-08 DIAGNOSIS — L989 Disorder of the skin and subcutaneous tissue, unspecified: Secondary | ICD-10-CM

## 2023-05-08 DIAGNOSIS — N1831 Chronic kidney disease, stage 3a: Secondary | ICD-10-CM | POA: Diagnosis not present

## 2023-05-08 DIAGNOSIS — E7800 Pure hypercholesterolemia, unspecified: Secondary | ICD-10-CM | POA: Diagnosis not present

## 2023-05-08 DIAGNOSIS — Z23 Encounter for immunization: Secondary | ICD-10-CM

## 2023-05-08 DIAGNOSIS — I351 Nonrheumatic aortic (valve) insufficiency: Secondary | ICD-10-CM

## 2023-05-08 DIAGNOSIS — I10 Essential (primary) hypertension: Secondary | ICD-10-CM | POA: Diagnosis not present

## 2023-05-08 MED ORDER — ALENDRONATE SODIUM 10 MG PO TABS: 10.0000 mg | ORAL_TABLET | Freq: Every day | ORAL | 11 refills | Status: AC

## 2023-05-08 NOTE — Progress Notes (Signed)
 Subjective:    Patient ID: Julia Robinson, female    DOB: 1940/05/31, 83 y.o.   MRN: [id number]  She is here today for follow-up.  She has a history of HTN, HLD, and aortic valve insufficiency.  Patient is due for the new pneumonia vaccine, the flu shot, and RSV vaccine.  She declines all vaccinations except for Capvaxive.  She has history of osteoporosis.  She refused Fosamax  in the past.  Her T-score was less than negative for in 2022.  We discussed this today including trying Evenity or Prolia.  She is now willing to take Fosamax  on a daily basis.  Due to her age she does not require colonoscopy or Pap smear.  She refuses a mammogram.  Patient had an echocardiogram in August 2024 that showed no significant change in her aortic valve regurgitation Past Medical History:  Diagnosis Date   Diverticulosis    High cholesterol    Hyperlipidemia    Hypertension    Moderate aortic regurgitation    with mild aortic stenosis echo 10/20   Osteoporosis    Prolapsed uterus    Past Surgical History:  Procedure Laterality Date   ABDOMINAL HYSTERECTOMY     A+P repair 2007   BACK SURGERY     CLOSED REDUCTION FINGER WITH PERCUTANEOUS PINNING Left 09/10/2015   Procedure: CLOSED REDUCTION FINGER WITH PERCUTANEOUS PINNING;  Surgeon: Brunilda Capra, MD;  Location: [facility] OR;  Service: Orthopedics;  Laterality: Left;   I & D EXTREMITY Left 09/10/2015   Procedure: IRRIGATION AND DEBRIDEMENT THUMB, REPAIR OF SKIN AND NAIL BED;  Surgeon: Brunilda Capra, MD;  Location: [facility] OR;  Service: Orthopedics;  Laterality: Left;   Current Outpatient Medications on File Prior to Visit  Medication Sig Dispense Refill   aspirin 81 MG tablet Take 81 mg by mouth daily.     Calcium Carbonate-Vitamin D3 600-400 MG-UNIT TABS Take by mouth.     Cyanocobalamin  1000 MCG CAPS Take 1 capsule by mouth daily.     doxazosin  (CARDURA ) 2 MG tablet Take 1 tablet (2 mg total) by mouth daily. 90 tablet 3   Iron , Ferrous Sulfate , 325 (65 Fe) MG  TABS Take 325 mg by mouth daily. 60 tablet 3   lovastatin  (MEVACOR ) 20 MG tablet TAKE 1 TABLET AT BEDTIME 90 tablet 3   Magnesium 400 MG CAPS Take 1 tablet by mouth daily.      piroxicam  (FELDENE ) 20 MG capsule TAKE 1 CAPSULE BY MOUTH ONCE DAILY AS NEEDED FOR  PAIN. APPOINTMENT REQUIRED FOR FUTURE REFILLS. 30 capsule 0   Potassium Gluconate 595 MG CAPS Take 1 capsule by mouth daily.      azithromycin  (ZITHROMAX ) 250 MG tablet 2 tabs poqday1, 1 tab poqday 2-5 (Patient not taking: Reported on 05/08/2023) 6 tablet 0   benzonatate  (TESSALON ) 200 MG capsule Take 1 capsule (200 mg total) by mouth 2 (two) times daily as needed for cough. (Patient not taking: Reported on 05/08/2023) 20 capsule 0   No current facility-administered medications on file prior to visit.   Allergies  Allergen Reactions   Ace Inhibitors     Cough   Amlodipine     Swelling   Codeine     unknown   Crestor [Rosuvastatin]     unknown   Dramamine [Dimenhydrinate]     unknown   Lipitor [Atorvastatin]     Constipation   Quinine Derivatives     Hives   Sulfa Antibiotics     unknown   Social  History   Socioeconomic History   Marital status: Married    Spouse name: Not on file   Number of children: Not on file   Years of education: Not on file   Highest education level: Not on file  Occupational History   Not on file  Tobacco Use   Smoking status: Never   Smokeless tobacco: Former    Types: Chew  Substance and Sexual Activity   Alcohol use: No   Drug use: No   Sexual activity: Yes  Other Topics Concern   Not on file  Social History Narrative   Not on file   Social Drivers of Health   Financial Resource Strain: Low Risk  (01/19/2023)   Overall Financial Resource Strain (CARDIA)    Difficulty of Paying Living Expenses: Not hard at all  Food Insecurity: No Food Insecurity (01/19/2023)   Hunger Vital Sign    Worried About Running Out of Food in the Last Year: Never true    Ran Out of Food in the Last  Year: Never true  Transportation Needs: No Transportation Needs (01/19/2023)   PRAPARE - Administrator, Civil Service (Medical): No    Lack of Transportation (Non-Medical): No  Physical Activity: Sufficiently Active (01/19/2023)   Exercise Vital Sign    Days of Exercise per Week: 5 days    Minutes of Exercise per Session: 30 min  Stress: No Stress Concern Present (01/19/2023)   [employer] of Occupational Health - Occupational Stress Questionnaire    Feeling of Stress : Not at all  Social Connections: Socially Integrated (01/19/2023)   Social Connection and Isolation Panel [NHANES]    Frequency of Communication with Friends and Family: More than three times a week    Frequency of Social Gatherings with Friends and Family: Three times a week    Attends Religious Services: 1 to 4 times per year    Active Member of Clubs or Organizations: Yes    Attends Banker Meetings: 1 to 4 times per year    Marital Status: Married  Catering manager Violence: Not At Risk (01/19/2023)   Humiliation, Afraid, Rape, and Kick questionnaire    Fear of Current or Ex-Partner: No    Emotionally Abused: No    Physically Abused: No    Sexually Abused: No   Family History  Problem Relation Age of Onset   Heart disease Mother    Diabetes Sister    Hypertension Sister    Hyperlipidemia Sister    Cancer Maternal Aunt        lung      Review of Systems  All other systems reviewed and are negative.      Objective:   Physical Exam Vitals reviewed.  Constitutional:      General: She is not in acute distress.    Appearance: She is well-developed. She is not diaphoretic.  HENT:     Head: Normocephalic and atraumatic.     Right Ear: External ear normal.     Left Ear: External ear normal.     Nose: Nose normal.     Mouth/Throat:     Pharynx: No oropharyngeal exudate.  Eyes:     General: No scleral icterus.       Right eye: No discharge.        Left eye: No  discharge.     Conjunctiva/sclera: Conjunctivae normal.     Pupils: Pupils are equal, round, and reactive to light.  Neck:  Thyroid : No thyromegaly.     Vascular: No JVD.     Trachea: No tracheal deviation.  Cardiovascular:     Rate and Rhythm: Normal rate and regular rhythm.     Heart sounds: Murmur heard.     No friction rub. No gallop.  Pulmonary:     Effort: Pulmonary effort is normal. No respiratory distress.     Breath sounds: Normal breath sounds. No stridor. No wheezing or rales.  Chest:     Chest wall: No tenderness.  Abdominal:     General: Bowel sounds are normal. There is no distension.     Palpations: Abdomen is soft. There is no mass.     Tenderness: There is no abdominal tenderness. There is no guarding or rebound.  Musculoskeletal:        General: No tenderness. Normal range of motion.     Cervical back: Normal range of motion and neck supple.  Lymphadenopathy:     Cervical: No cervical adenopathy.  Skin:    General: Skin is warm.     Coloration: Skin is not pale.     Findings: No erythema or rash.  Neurological:     Mental Status: She is alert and oriented to person, place, and time.     Cranial Nerves: No cranial nerve deficit.     Motor: No abnormal muscle tone.     Coordination: Coordination normal.     Deep Tendon Reflexes: Reflexes are normal and symmetric.  Psychiatric:        Behavior: Behavior normal.        Thought Content: Thought content normal.        Judgment: Judgment normal.           Assessment & Plan:  Benign essential HTN - Plan: CBC with Differential/Platelet, COMPLETE METABOLIC PANEL WITH GFR, Lipid panel  Stage 3a chronic kidney disease (HCC) - Plan: CBC with Differential/Platelet, COMPLETE METABOLIC PANEL WITH GFR, Lipid panel  Pure hypercholesterolemia - Plan: CBC with Differential/Platelet, COMPLETE METABOLIC PANEL WITH GFR, Lipid panel  Aortic valve insufficiency, etiology of cardiac valve disease  unspecified  Non-healing skin lesion of nose - Plan: Ambulatory referral to Dermatology  Need for vaccination - Plan: Pneumococcal Conjugate PCV21(Capvaxive) Patient received a pneumonia vaccine today.  She declined a flu shot.  She declined RSV vaccine.  She had the shingles vaccine at another location.  She declines a mammogram.  She does not need a Pap smear or colonoscopy.  There is a nonhealing lesion on her left nostril that appears to be a skin cancer.  I was able to convince the patient to see a dermatologist to evaluate this.  Patient will start Fosamax  10 mg daily for osteoporosis.  Meanwhile we will check a CBC a CMP and a lipid panel.

## 2023-05-09 LAB — CBC WITH DIFFERENTIAL/PLATELET
Absolute Lymphocytes: 1325 {cells}/uL (ref 850–3900)
Absolute Monocytes: 360 {cells}/uL (ref 200–950)
Basophils Absolute: 38 {cells}/uL (ref 0–200)
Basophils Relative: 0.8 %
Eosinophils Absolute: 101 {cells}/uL (ref 15–500)
Eosinophils Relative: 2.1 %
HCT: 36 % (ref 35.0–45.0)
Hemoglobin: 11.5 g/dL — ABNORMAL LOW (ref 11.7–15.5)
MCH: 28.5 pg (ref 27.0–33.0)
MCHC: 31.9 g/dL — ABNORMAL LOW (ref 32.0–36.0)
MCV: 89.3 fL (ref 80.0–100.0)
MPV: 9.8 fL (ref 7.5–12.5)
Monocytes Relative: 7.5 %
Neutro Abs: 2976 {cells}/uL (ref 1500–7800)
Neutrophils Relative %: 62 %
Platelets: 259 10*3/uL (ref 140–400)
RBC: 4.03 10*6/uL (ref 3.80–5.10)
RDW: 13.7 % (ref 11.0–15.0)
Total Lymphocyte: 27.6 %
WBC: 4.8 10*3/uL (ref 3.8–10.8)

## 2023-05-09 LAB — COMPLETE METABOLIC PANEL WITH GFR
AG Ratio: 1.7 (calc) (ref 1.0–2.5)
ALT: 8 U/L (ref 6–29)
AST: 14 U/L (ref 10–35)
Albumin: 4.5 g/dL (ref 3.6–5.1)
Alkaline phosphatase (APISO): 54 U/L (ref 37–153)
BUN: 15 mg/dL (ref 7–25)
CO2: 29 mmol/L (ref 20–32)
Calcium: 10.2 mg/dL (ref 8.6–10.4)
Chloride: 104 mmol/L (ref 98–110)
Creat: 0.92 mg/dL (ref 0.60–0.95)
Globulin: 2.7 g/dL (ref 1.9–3.7)
Glucose, Bld: 93 mg/dL (ref 65–99)
Potassium: 3.6 mmol/L (ref 3.5–5.3)
Sodium: 141 mmol/L (ref 135–146)
Total Bilirubin: 0.5 mg/dL (ref 0.2–1.2)
Total Protein: 7.2 g/dL (ref 6.1–8.1)
eGFR: 63 mL/min/{1.73_m2} (ref >=60)

## 2023-05-09 LAB — LIPID PANEL
Cholesterol: 190 mg/dL (ref <200)
HDL: 85 mg/dL (ref >=50)
LDL Cholesterol (Calc): 90 mg/dL
Non-HDL Cholesterol (Calc): 105 mg/dL (ref <130)
Total CHOL/HDL Ratio: 2.2 (calc) (ref <5.0)
Triglycerides: 65 mg/dL (ref <150)

## 2023-06-06 ENCOUNTER — Other Ambulatory Visit: Payer: Self-pay | Admitting: Family Medicine

## 2023-06-06 NOTE — Telephone Encounter (Signed)
 Copied from CRM [id number]. Topic: Clinical - Medication Refill >> Jun 06, 2023  1:54 PM Almira Coaster wrote: Most Recent Primary Care Visit:  Provider: Lynnea Ferrier T  Department: [facility]  Visit Type: PHYSICAL  Date: 05/08/2023  Medication: doxazosin (CARDURA) 2 MG tablet, lovastatin (MEVACOR) 20 MG tablet & piroxicam (FELDENE) 20 MG capsule  Has the patient contacted their pharmacy? No (Agent: If no, request that the patient contact the pharmacy for the refill. If patient does not wish to contact the pharmacy document the reason why and proceed with request.) (Agent: If yes, when and what did the pharmacy advise?)  Is this the correct pharmacy for this prescription? Yes If no, delete pharmacy and type the correct one.  This is the patient's preferred pharmacy:  [facility] - [place], [place] - [address] [place] [address] Phone: [phone] Fax: [phone]  [facility] [place] ([place]), [place] - [address] [place] ([place]) [place] [address] Phone: [phone] Fax: [phone]   Has the prescription been filled recently? No  Is the patient out of the medication? No  Has the patient been seen for an appointment in the last year OR does the patient have an upcoming appointment? Yes  Can we respond through MyChart? Yes  Agent: Please be advised that Rx refills may take up to 3 business days. We ask that you follow-up with your pharmacy.

## 2023-06-07 MED ORDER — PIROXICAM 20 MG PO CAPS: ORAL_CAPSULE | ORAL | 0 refills | Status: DC

## 2023-06-07 NOTE — Telephone Encounter (Signed)
 Requested Prescriptions  Pending Prescriptions Disp Refills   doxazosin (CARDURA) 2 MG tablet 90 tablet     Sig: Take 1 tablet (2 mg total) by mouth daily.     Cardiovascular:  Alpha Blockers Failed - 06/07/2023 11:23 AM      Failed - Valid encounter within last 6 months    Recent Outpatient Visits           2 years ago COVID   [facility] Pickard, Priscille Heidelberg, MD   2 years ago Nonrheumatic aortic valve insufficiency   [facility] Tanya Nones, Priscille Heidelberg, MD   2 years ago Benign essential HTN   [facility] Tanya Nones, Priscille Heidelberg, MD   2 years ago Benign essential HTN   [facility] Tanya Nones, Priscille Heidelberg, MD   3 years ago Anemia, unspecified type   [facility] Pickard, Priscille Heidelberg, MD              Passed - Last BP in normal range    BP Readings from Last 1 Encounters:  05/08/23 130/70          lovastatin (MEVACOR) 20 MG tablet 90 tablet     Sig: TAKE 1 TABLET AT BEDTIME     Cardiovascular:  Antilipid - Statins 2 Failed - 06/07/2023 11:23 AM      Failed - Valid encounter within last 12 months    Recent Outpatient Visits           2 years ago COVID   [facility] Pickard, Priscille Heidelberg, MD   2 years ago Nonrheumatic aortic valve insufficiency   [facility] Tanya Nones, Priscille Heidelberg, MD   2 years ago Benign essential HTN   [facility] Tanya Nones, Priscille Heidelberg, MD   2 years ago Benign essential HTN   [facility] Tanya Nones, Priscille Heidelberg, MD   3 years ago Anemia, unspecified type   [facility] [name], MD              Failed - Lipid Panel in normal range within the last 12 months    Cholesterol  Date Value Ref Range Status  05/08/2023 190 <200 mg/dL Final   LDL Cholesterol (Calc)  Date Value Ref Range Status  05/08/2023 90 mg/dL (calc) Final    Comment:    Reference range: <100 . Desirable range <100 mg/dL for primary  prevention;   <70 mg/dL for patients with CHD or diabetic patients  with > or = 2 CHD risk factors. Marland Kitchen LDL-C is now calculated using the Martin-Hopkins  calculation, which is a validated novel method providing  better accuracy than the Friedewald equation in the  estimation of LDL-C.  Horald Pollen et al. Lenox Ahr. 1610;960(45): 2061-2068  (http://education.[facility].com/faq/FAQ164)    HDL  Date Value Ref Range Status  05/08/2023 85 > OR = 50 mg/dL Final   Triglycerides  Date Value Ref Range Status  05/08/2023 65 <150 mg/dL Final         Passed - Cr in normal range and within 360 days    Creat  Date Value Ref Range Status  05/08/2023 0.92 0.60 - 0.95 mg/dL Final         Passed - Patient is not pregnant       piroxicam (FELDENE) 20 MG capsule 30 capsule 0    Sig: TAKE 1 CAPSULE BY MOUTH ONCE DAILY AS NEEDED FOR  PAIN. APPOINTMENT  REQUIRED FOR FUTURE REFILLS.     Analgesics:  NSAIDS Failed - 06/07/2023 11:23 AM      Failed - Manual Review: Labs are only required if the patient has taken medication for more than 8 weeks.      Failed - HGB in normal range and within 360 days    Hemoglobin  Date Value Ref Range Status  05/08/2023 11.5 (L) 11.7 - 15.5 g/dL Final         Failed - Valid encounter within last 12 months    Recent Outpatient Visits           2 years ago COVID   [facility] Pickard, Priscille Heidelberg, MD   2 years ago Nonrheumatic aortic valve insufficiency   [facility] Tanya Nones, Priscille Heidelberg, MD   2 years ago Benign essential HTN   [facility] [name], MD   2 years ago Benign essential HTN   [facility] [name], MD   3 years ago Anemia, unspecified type   [facility] Pickard, Priscille Heidelberg, MD              Passed - Cr in normal range and within 360 days    Creat  Date Value Ref Range Status  05/08/2023 0.92 0.60 - 0.95 mg/dL Final         Passed - PLT in  normal range and within 360 days    Platelets  Date Value Ref Range Status  05/08/2023 259 140 - 400 Thousand/uL Final         Passed - HCT in normal range and within 360 days    HCT  Date Value Ref Range Status  05/08/2023 36.0 35.0 - 45.0 % Final         Passed - eGFR is 30 or above and within 360 days    GFR, Est African American  Date Value Ref Range Status  06/10/2020 52 (L) > OR = 60 mL/min/1.32m2 Final   GFR, Est Non African American  Date Value Ref Range Status  06/10/2020 44 (L) > OR = 60 mL/min/1.18m2 Final   eGFR  Date Value Ref Range Status  05/08/2023 63 > OR = 60 mL/min/1.41m2 Final         Passed - Patient is not pregnant

## 2023-06-27 DIAGNOSIS — X32XXXA Exposure to sunlight, initial encounter: Secondary | ICD-10-CM | POA: Diagnosis not present

## 2023-06-27 DIAGNOSIS — L570 Actinic keratosis: Secondary | ICD-10-CM | POA: Diagnosis not present

## 2023-06-27 DIAGNOSIS — D0439 Carcinoma in situ of skin of other parts of face: Secondary | ICD-10-CM | POA: Diagnosis not present

## 2023-06-28 ENCOUNTER — Other Ambulatory Visit: Payer: Self-pay | Admitting: Family Medicine

## 2023-06-28 NOTE — Telephone Encounter (Signed)
 Copied from CRM [id number]. Topic: Clinical - Medication Refill >> Jun 28, 2023  1:58 PM Elle L wrote: Most Recent Primary Care Visit:  Provider: Lynnea Ferrier T  Department: [facility]  Visit Type: PHYSICAL  Date: 05/08/2023  Medication: doxazosin (CARDURA) 2 MG tablet AND lovastatin (MEVACOR) 20 MG tablet AND piroxicam (FELDENE) 20 MG capsule  Has the patient contacted their pharmacy? Yes  Is this the correct pharmacy for this prescription? Yes This is the patient's preferred pharmacy:   [id number] - [place] ([place]), [place] - [address] [place] ([place]) [place] [address] Phone: [phone] Fax: [phone]  Has the prescription been filled recently? No  Is the patient out of the medication? Yes  Has the patient been seen for an appointment in the last year OR does the patient have an upcoming appointment? Yes  Can we respond through MyChart? Yes  Agent: Please be advised that Rx refills may take up to 3 business days. We ask that you follow-up with your pharmacy.

## 2023-06-29 MED ORDER — LOVASTATIN 20 MG PO TABS: ORAL_TABLET | ORAL | 3 refills | Status: AC

## 2023-06-29 MED ORDER — DOXAZOSIN MESYLATE 2 MG PO TABS: 2.0000 mg | ORAL_TABLET | Freq: Every day | ORAL | 3 refills | Status: AC

## 2023-06-29 MED ORDER — PIROXICAM 20 MG PO CAPS: ORAL_CAPSULE | ORAL | 3 refills | Status: AC

## 2023-06-29 NOTE — Telephone Encounter (Signed)
 Requested Prescriptions  Pending Prescriptions Disp Refills   lovastatin (MEVACOR) 20 MG tablet 90 tablet 3    Sig: TAKE 1 TABLET AT BEDTIME     Cardiovascular:  Antilipid - Statins 2 Failed - 06/29/2023  5:55 PM      Failed - Lipid Panel in normal range within the last 12 months    Cholesterol  Date Value Ref Range Status  05/08/2023 190 <200 mg/dL Final   LDL Cholesterol (Calc)  Date Value Ref Range Status  05/08/2023 90 mg/dL (calc) Final    Comment:    Reference range: <100 . Desirable range <100 mg/dL for primary prevention;   <70 mg/dL for patients with CHD or diabetic patients  with > or = 2 CHD risk factors. Marland Kitchen LDL-C is now calculated using the Martin-Hopkins  calculation, which is a validated novel method providing  better accuracy than the Friedewald equation in the  estimation of LDL-C.  Horald Pollen et al. Lenox Ahr. 1610;960(45): 2061-2068  (http://education.[facility].com/faq/FAQ164)    HDL  Date Value Ref Range Status  05/08/2023 85 > OR = 50 mg/dL Final   Triglycerides  Date Value Ref Range Status  05/08/2023 65 <150 mg/dL Final         Passed - Cr in normal range and within 360 days    Creat  Date Value Ref Range Status  05/08/2023 0.92 0.60 - 0.95 mg/dL Final         Passed - Patient is not pregnant      Passed - Valid encounter within last 12 months    Recent Outpatient Visits           1 month ago Benign essential HTN   [place] [facility] Pickard, Priscille Heidelberg, MD   3 months ago Viral URI with cough   [place] [facility] Park Meo, FNP   5 months ago Memory loss   [place] [facility] Tanya Nones, Priscille Heidelberg, MD   6 months ago Weakness   [place] [facility] [name], MD   8 months ago Benign essential HTN   [facility] Pickard, Priscille Heidelberg, MD               piroxicam (FELDENE) 20 MG capsule 30 capsule 0    Sig: TAKE 1  CAPSULE BY MOUTH ONCE DAILY AS NEEDED FOR  PAIN. APPOINTMENT REQUIRED FOR FUTURE REFILLS.     Analgesics:  NSAIDS Failed - 06/29/2023  5:55 PM      Failed - Manual Review: Labs are only required if the patient has taken medication for more than 8 weeks.      Failed - HGB in normal range and within 360 days    Hemoglobin  Date Value Ref Range Status  05/08/2023 11.5 (L) 11.7 - 15.5 g/dL Final         Passed - Cr in normal range and within 360 days    Creat  Date Value Ref Range Status  05/08/2023 0.92 0.60 - 0.95 mg/dL Final         Passed - PLT in normal range and within 360 days    Platelets  Date Value Ref Range Status  05/08/2023 259 140 - 400 Thousand/uL Final         Passed - HCT in normal range and within 360 days    HCT  Date Value Ref Range Status  05/08/2023 36.0 35.0 - 45.0 %  Final         Passed - eGFR is 30 or above and within 360 days    GFR, Est African American  Date Value Ref Range Status  06/10/2020 52 (L) > OR = 60 mL/min/1.45m2 Final   GFR, Est Non African American  Date Value Ref Range Status  06/10/2020 44 (L) > OR = 60 mL/min/1.9m2 Final   eGFR  Date Value Ref Range Status  05/08/2023 63 > OR = 60 mL/min/1.10m2 Final         Passed - Patient is not pregnant      Passed - Valid encounter within last 12 months    Recent Outpatient Visits           1 month ago Benign essential HTN   [place] [facility] Pickard, Priscille Heidelberg, MD   3 months ago Viral URI with cough   [facility] Park Meo, FNP   5 months ago Memory loss   [place] [facility] Tanya Nones, Priscille Heidelberg, MD   6 months ago Weakness   [place] [facility] [name], MD   8 months ago Benign essential HTN   [place] [facility] Pickard, Priscille Heidelberg, MD               doxazosin (CARDURA) 2 MG tablet 90 tablet 3    Sig: Take 1 tablet (2 mg total) by mouth daily.      Cardiovascular:  Alpha Blockers Passed - 06/29/2023  5:55 PM      Passed - Last BP in normal range    BP Readings from Last 1 Encounters:  05/08/23 130/70         Passed - Valid encounter within last 6 months    Recent Outpatient Visits           1 month ago Benign essential HTN   [place] [facility] Tanya Nones, Priscille Heidelberg, MD   3 months ago Viral URI with cough   [place] [facility] Park Meo, FNP   5 months ago Memory loss   [place] [facility] Tanya Nones, Priscille Heidelberg, MD   6 months ago Weakness   [place] [facility] [name], MD   8 months ago Benign essential HTN   [place] [facility] Pickard, Priscille Heidelberg, MD

## 2023-11-15 ENCOUNTER — Telehealth: Payer: Self-pay

## 2023-11-15 NOTE — Telephone Encounter (Signed)
 Fax received from [employer] support requesting benefit information on patient. Prolia is not on pt's current medication list. Spoke with pt and pt declines restarting medication. Mjp,lpn

## 2024-01-24 ENCOUNTER — Telehealth: Payer: Self-pay | Admitting: *Deleted

## 2024-01-24 NOTE — Telephone Encounter (Signed)
 Called to confirm telephone visit for AWV

## 2024-01-25 ENCOUNTER — Ambulatory Visit: Payer: Medicare HMO

## 2024-01-25 VITALS — Ht 64.0 in | Wt 124.0 lb

## 2024-01-25 DIAGNOSIS — Z0000 Encounter for general adult medical examination without abnormal findings: Secondary | ICD-10-CM | POA: Diagnosis not present

## 2024-01-25 NOTE — Progress Notes (Signed)
 Subjective:   Julia Robinson is a 83 y.o. female who presents for Medicare Annual (Subsequent) preventive examination.  Visit Complete: Virtual I connected with  Nyjai Graff on 01/25/24 by a audio enabled telemedicine application and verified that I am speaking with the correct person using two identifiers.  Patient Location: Home  Provider Location: Home Office  I discussed the limitations of evaluation and management by telemedicine. The patient expressed understanding and agreed to proceed.  Vital Signs: Because this visit was a virtual/telehealth visit, some criteria may be missing or patient reported. Any vitals not documented were not able to be obtained and vitals that have been documented are patient reported.   Cardiac Risk Factors include: advanced age (>6men, >66 women);family history of premature cardiovascular disease;hypertension     Objective:    Today's Vitals   01/25/24 1408  Weight: 124 lb (56.2 kg)  Height: 5' 4 (1.626 m)   Body mass index is 21.28 kg/m.     01/25/2024    2:04 PM 01/19/2023   12:58 PM 12/17/2021    3:57 PM 11/14/2019    9:37 AM 09/10/2015    5:54 PM  Advanced Directives  Does Patient Have a Medical Advance Directive? No No No Yes Yes   Type of Theme Park Manager;Living will Healthcare Power of [place];Living will   Would patient like information on creating a medical advance directive? No - Patient declined Yes (MAU/Ambulatory/Procedural Areas - Information given)        Data saved with a previous flowsheet row definition    Current Medications (verified) Outpatient Encounter Medications as of 01/25/2024  Medication Sig   alendronate  (FOSAMAX ) 10 MG tablet Take 1 tablet (10 mg total) by mouth daily before breakfast. Take with a full glass of water on an empty stomach.   aspirin 81 MG tablet Take 81 mg by mouth daily.   Calcium Carbonate-Vitamin D3 600-400 MG-UNIT TABS Take by mouth.    Cyanocobalamin  1000 MCG CAPS Take 1 capsule by mouth daily.   doxazosin  (CARDURA ) 2 MG tablet Take 1 tablet (2 mg total) by mouth daily.   Iron , Ferrous Sulfate , 325 (65 Fe) MG TABS Take 325 mg by mouth daily.   lovastatin  (MEVACOR ) 20 MG tablet TAKE 1 TABLET AT BEDTIME   Magnesium 400 MG CAPS Take 1 tablet by mouth daily.    piroxicam  (FELDENE ) 20 MG capsule TAKE 1 CAPSULE BY MOUTH ONCE DAILY AS NEEDED FOR  PAIN.   Potassium Gluconate 595 MG CAPS Take 1 capsule by mouth daily.    azithromycin  (ZITHROMAX ) 250 MG tablet 2 tabs poqday1, 1 tab poqday 2-5 (Patient not taking: Reported on 01/25/2024)   benzonatate  (TESSALON ) 200 MG capsule Take 1 capsule (200 mg total) by mouth 2 (two) times daily as needed for cough. (Patient not taking: Reported on 01/25/2024)   No facility-administered encounter medications on file as of 01/25/2024.    Allergies (verified) Ace inhibitors, Amlodipine, Codeine, Crestor [rosuvastatin], Dramamine [dimenhydrinate], Lipitor [atorvastatin], Quinine derivatives, and Sulfa antibiotics   History: Past Medical History:  Diagnosis Date   Diverticulosis    High cholesterol    Hyperlipidemia    Hypertension    Moderate aortic regurgitation    with mild aortic stenosis echo 10/20   Osteoporosis    Prolapsed uterus    Past Surgical History:  Procedure Laterality Date   ABDOMINAL HYSTERECTOMY     A+P repair 2007   BACK SURGERY     CLOSED  REDUCTION FINGER WITH PERCUTANEOUS PINNING Left 09/10/2015   Procedure: CLOSED REDUCTION FINGER WITH PERCUTANEOUS PINNING;  Surgeon: Franky Curia, MD;  Location: [facility] OR;  Service: Orthopedics;  Laterality: Left;   I & D EXTREMITY Left 09/10/2015   Procedure: IRRIGATION AND DEBRIDEMENT THUMB, REPAIR OF SKIN AND NAIL BED;  Surgeon: Franky Curia, MD;  Location: [facility] OR;  Service: Orthopedics;  Laterality: Left;   Family History  Problem Relation Age of Onset   Heart disease Mother    Diabetes Sister    Hypertension Sister     Hyperlipidemia Sister    Cancer Maternal Aunt        lung   Social History   Socioeconomic History   Marital status: Married    Spouse name: Not on file   Number of children: Not on file   Years of education: Not on file   Highest education level: Not on file  Occupational History   Not on file  Tobacco Use   Smoking status: Never   Smokeless tobacco: Former    Types: Chew  Substance and Sexual Activity   Alcohol use: No   Drug use: No   Sexual activity: Yes  Other Topics Concern   Not on file  Social History Narrative   Not on file   Social Drivers of Health   Financial Resource Strain: Low Risk  (01/25/2024)   Overall Financial Resource Strain (CARDIA)    Difficulty of Paying Living Expenses: Not hard at all  Food Insecurity: No Food Insecurity (01/25/2024)   Hunger Vital Sign    Worried About Running Out of Food in the Last Year: Never true    Ran Out of Food in the Last Year: Never true  Transportation Needs: No Transportation Needs (01/25/2024)   PRAPARE - Administrator, Civil Service (Medical): No    Lack of Transportation (Non-Medical): No  Physical Activity: Insufficiently Active (01/25/2024)   Exercise Vital Sign    Days of Exercise per Week: 3 days    Minutes of Exercise per Session: 30 min  Stress: No Stress Concern Present (01/25/2024)   [employer] of Occupational Health - Occupational Stress Questionnaire    Feeling of Stress: Not at all  Social Connections: Socially Integrated (01/25/2024)   Social Connection and Isolation Panel    Frequency of Communication with Friends and Family: More than three times a week    Frequency of Social Gatherings with Friends and Family: More than three times a week    Attends Religious Services: More than 4 times per year    Active Member of [employer] or Organizations: Yes    Attends Engineer, Structural: More than 4 times per year    Marital Status: Married    Tobacco  Counseling Counseling given: Not Answered   Clinical Intake:  Pre-visit preparation completed: Yes  Pain : No/denies pain     Diabetes: No  How often do you need to have someone help you when you read instructions, pamphlets, or other written materials from your doctor or pharmacy?: 1 - Never  Interpreter Needed?: No  Information entered by :: Mliss Graff LPN   Activities of Daily Living    01/25/2024    2:08 PM  In your present state of health, do you have any difficulty performing the following activities:  Hearing? 0  Vision? 0  Difficulty concentrating or making decisions? 1  Walking or climbing stairs? 0  Dressing or bathing? 0  Doing errands, shopping? 1  Preparing Food and eating ? N  Using the Toilet? N  In the past six months, have you accidently leaked urine? N  Do you have problems with loss of bowel control? N  Managing your Medications? N  Managing your Finances? N  Housekeeping or managing your Housekeeping? N    Patient Care Team: Duanne Butler DASEN, MD as PCP - General (Family Medicine) Harvey Margo [facility]OME, MD (Inactive) as Consulting Physician (Gastroenterology) Nicholaus Sherlean CROME, [facility] (Inactive) as Pharmacist (Pharmacist)  Indicate any recent Medical Services you may have received from other than [facility] providers in the past year (date may be approximate).     Assessment:   This is a routine wellness examination for [place].  Hearing/Vision screen Hearing Screening - Comments:: Some trouble hearing Vision Screening - Comments:: Cataract surgery  [facility]   Goals Addressed             This Visit's Progress    Patient Stated       Remain independent     Remain active and independent   On track      Depression Screen    01/25/2024    2:05 PM 05/08/2023    8:42 AM 01/19/2023   12:57 PM 01/04/2023    2:14 PM 10/28/2022    8:11 AM 04/22/2022    8:48 AM 12/17/2021    3:41 PM  PHQ 2/9 Scores  PHQ - 2 Score 0 0 0 0 0 0 0  PHQ- 9 Score 0           Fall Risk    01/25/2024    2:13 PM 05/08/2023    8:42 AM 01/19/2023   12:58 PM 01/04/2023    2:14 PM 10/28/2022    8:11 AM  Fall Risk   Falls in the past year? 0 0 0 0 0  Number falls in past yr: 0 0 0 0 0  Injury with Fall? 0 0 0 0 0  Risk for fall due to :  No Fall Risks No Fall Risks No Fall Risks No Fall Risks  Follow up Falls evaluation completed;Education provided;Falls prevention discussed Falls prevention discussed;Falls evaluation completed Falls prevention discussed;Education provided;Falls evaluation completed Falls prevention discussed Falls prevention discussed    MEDICARE RISK AT HOME: Medicare Risk at Home Any stairs in or around the home?: No If so, are there any without handrails?: No Home free of loose throw rugs in walkways, pet beds, electrical cords, etc?: Yes Adequate lighting in your home to reduce risk of falls?: Yes Life alert?: No Use of a cane, walker or w/c?: No Grab bars in the bathroom?: Yes Shower chair or bench in shower?: Yes Elevated toilet seat or a handicapped toilet?: Yes  TIMED UP AND GO:  Was the test performed?  No    Cognitive Function:        01/25/2024    2:06 PM 01/19/2023   12:58 PM 11/14/2019    9:36 AM  6CIT Screen  What Year? 0 points 0 points 0 points  What month? 0 points 0 points 0 points  What time? 0 points 0 points 0 points  Count back from 20 4 points 0 points 0 points  Months in reverse 4 points 0 points 0 points  Repeat phrase 10 points 0 points 0 points  Total Score 18 points 0 points 0 points    Immunizations Immunization History  Administered Date(s) Administered   Fluad Quad(high Dose 65+) 12/25/2018   INFLUENZA, HIGH DOSE SEASONAL PF 01/18/2018  Influenza,inj,Quad PF,6+ Mos 01/08/2013, 12/25/2013, 12/16/2014, 12/17/2015, 12/27/2016   Pneumococcal Conjugate Pcv21, Polysaccharide Crm197 Conjugaf 05/08/2023   Pneumococcal Conjugate-13 09/18/2014   Pneumococcal Polysaccharide-23 12/28/2007,  12/25/2013   Td 12/27/1998   Tdap 09/10/2015    TDAP status: Up to date  Flu Vaccine status: Due, Education has been provided regarding the importance of this vaccine. Advised may receive this vaccine at local pharmacy or Health Dept. Aware to provide a copy of the vaccination record if obtained from local pharmacy or Health Dept. Verbalized acceptance and understanding.  Pneumococcal vaccine status: Up to date  Covid-19 vaccine status: Declined, Education has been provided regarding the importance of this vaccine but patient still declined. Advised may receive this vaccine at local pharmacy or Health Dept.or vaccine clinic. Aware to provide a copy of the vaccination record if obtained from local pharmacy or Health Dept. Verbalized acceptance and understanding.  Qualifies for Shingles Vaccine? Yes   Zostavax completed No   Shingrix Completed?: No.    Education has been provided regarding the importance of this vaccine. Patient has been advised to call insurance company to determine out of pocket expense if they have not yet received this vaccine. Advised may also receive vaccine at local pharmacy or Health Dept. Verbalized acceptance and understanding.  Screening Tests Health Maintenance  Topic Date Due   Zoster Vaccines- Shingrix (1 of 2) Never done   Influenza Vaccine  06/25/2024 (Originally 10/27/2023)   Medicare Annual Wellness (AWV)  01/24/2025   DTaP/Tdap/Td (3 - Td or Tdap) 09/09/2025   Pneumococcal Vaccine: 50+ Years  Completed   DEXA SCAN  Completed   Meningococcal B Vaccine  Aged Out   Colonoscopy  Discontinued   COVID-19 Vaccine  Discontinued    Health Maintenance  Health Maintenance Due  Topic Date Due   Zoster Vaccines- Shingrix (1 of 2) Never done    Colorectal cancer screening: No longer required.   Mammogram status: No longer required due to  .  Bone Density education provided  Lung Cancer Screening: (Low Dose CT Chest recommended if Age 25-80 years, 20  pack-year currently smoking OR have quit w/in 15years.) does not qualify.   Lung Cancer Screening Referral:   Additional Screening:  Hepatitis C Screening: does not qualify;  Vision Screening: Recommended annual ophthalmology exams for early detection of glaucoma and other disorders of the eye. Is the patient up to date with their annual eye exam?  No  Who is the provider or what is the name of the office in which the patient attends annual eye exams? [facility] If pt is not established with a provider, would they like to be referred to a provider to establish care? No .   Dental Screening: Recommended annual dental exams for proper oral hygiene    Community Resource Referral / Chronic Care Management: CRR required this visit?  No   CCM required this visit?  No     Plan:     I have personally reviewed and noted the following in the patient's chart:   Medical and social history Use of alcohol, tobacco or illicit drugs  Current medications and supplements including opioid prescriptions. Patient is not currently taking opioid prescriptions. Functional ability and status Nutritional status Physical activity Advanced directives List of other physicians Hospitalizations, surgeries, and ER visits in previous 12 months Vitals Screenings to include cognitive, depression, and falls Referrals and appointments  In addition, I have reviewed and discussed with patient certain preventive protocols, quality metrics, and best practice recommendations. A  written personalized care plan for preventive services as well as general preventive health recommendations were provided to patient.     Mliss Graff, LPN   [id number]   After Visit Summary: (MyChart) Due to this being a telephonic visit, the after visit summary with patients personalized plan was offered to patient via MyChart   Nurse Notes:

## 2024-01-25 NOTE — Patient Instructions (Signed)
 Julia Robinson , Thank you for taking time to come for your Medicare Wellness Visit. I appreciate your ongoing commitment to your health goals. Please review the following plan we discussed and let me know if I can assist you in the future.   Screening recommendations/referrals: Colonoscopy:  Mammogram:  Bone Density:  Recommended yearly ophthalmology/optometry visit for glaucoma screening and checkup Recommended yearly dental visit for hygiene and checkup  Vaccinations: Influenza vaccine:  Pneumococcal vaccine:  Tdap vaccine:  Shingles vaccine:         Preventive Care 65 Years and Older, Female Preventive care refers to lifestyle choices and visits with your health care provider that can promote health and wellness. What does preventive care include? A yearly physical exam. This is also called an annual well check. Dental exams once or twice a year. Routine eye exams. Ask your health care provider how often you should have your eyes checked. Personal lifestyle choices, including: Daily care of your teeth and gums. Regular physical activity. Eating a healthy diet. Avoiding tobacco and drug use. Limiting alcohol use. Practicing safe sex. Taking low-dose aspirin every day. Taking vitamin and mineral supplements as recommended by your health care provider. What happens during an annual well check? The services and screenings done by your health care provider during your annual well check will depend on your age, overall health, lifestyle risk factors, and family history of disease. Counseling  Your health care provider may ask you questions about your: Alcohol use. Tobacco use. Drug use. Emotional well-being. Home and relationship well-being. Sexual activity. Eating habits. History of falls. Memory and ability to understand (cognition). Work and work Astronomer. Reproductive health. Screening  You may have the following tests or measurements: Height, weight, and  BMI. Blood pressure. Lipid and cholesterol levels. These may be checked every 5 years, or more frequently if you are over 35 years old. Skin check. Lung cancer screening. You may have this screening every year starting at age 56 if you have a 30-pack-year history of smoking and currently smoke or have quit within the past 15 years. Fecal occult blood test (FOBT) of the stool. You may have this test every year starting at age 76. Flexible sigmoidoscopy or colonoscopy. You may have a sigmoidoscopy every 5 years or a colonoscopy every 10 years starting at age 38. Hepatitis C blood test. Hepatitis B blood test. Sexually transmitted disease (STD) testing. Diabetes screening. This is done by checking your blood sugar (glucose) after you have not eaten for a while (fasting). You may have this done every 1-3 years. Bone density scan. This is done to screen for osteoporosis. You may have this done starting at age 23. Mammogram. This may be done every 1-2 years. Talk to your health care provider about how often you should have regular mammograms. Talk with your health care provider about your test results, treatment options, and if necessary, the need for more tests. Vaccines  Your health care provider may recommend certain vaccines, such as: Influenza vaccine. This is recommended every year. Tetanus, diphtheria, and acellular pertussis (Tdap, Td) vaccine. You may need a Td booster every 10 years. Zoster vaccine. You may need this after age 20. Pneumococcal 13-valent conjugate (PCV13) vaccine. One dose is recommended after age 71. Pneumococcal polysaccharide (PPSV23) vaccine. One dose is recommended after age 59. Talk to your health care provider about which screenings and vaccines you need and how often you need them. This information is not intended to replace advice given to you by your  health care provider. Make sure you discuss any questions you have with your health care provider. Document  Released: 04/10/2015 Document Revised: 12/02/2015 Document Reviewed: 01/13/2015 Elsevier Interactive Patient Education  2017 [employer].  Fall Prevention in the Home Falls can cause injuries. They can happen to people of all ages. There are many things you can do to make your home safe and to help prevent falls. What can I do on the outside of my home? Regularly fix the edges of walkways and driveways and fix any cracks. Remove anything that might make you trip as you walk through a door, such as a raised step or threshold. Trim any bushes or trees on the path to your home. Use bright outdoor lighting. Clear any walking paths of anything that might make someone trip, such as rocks or tools. Regularly check to see if handrails are loose or broken. Make sure that both sides of any steps have handrails. Any raised decks and porches should have guardrails on the edges. Have any leaves, snow, or ice cleared regularly. Use sand or salt on walking paths during winter. Clean up any spills in your garage right away. This includes oil or grease spills. What can I do in the bathroom? Use night lights. Install grab bars by the toilet and in the tub and shower. Do not use towel bars as grab bars. Use non-skid mats or decals in the tub or shower. If you need to sit down in the shower, use a plastic, non-slip stool. Keep the floor dry. Clean up any water that spills on the floor as soon as it happens. Remove soap buildup in the tub or shower regularly. Attach bath mats securely with double-sided non-slip rug tape. Do not have throw rugs and other things on the floor that can make you trip. What can I do in the bedroom? Use night lights. Make sure that you have a light by your bed that is easy to reach. Do not use any sheets or blankets that are too big for your bed. They should not hang down onto the floor. Have a firm chair that has side arms. You can use this for support while you get dressed. Do  not have throw rugs and other things on the floor that can make you trip. What can I do in the kitchen? Clean up any spills right away. Avoid walking on wet floors. Keep items that you use a lot in easy-to-reach places. If you need to reach something above you, use a strong step stool that has a grab bar. Keep electrical cords out of the way. Do not use floor polish or wax that makes floors slippery. If you must use wax, use non-skid floor wax. Do not have throw rugs and other things on the floor that can make you trip. What can I do with my stairs? Do not leave any items on the stairs. Make sure that there are handrails on both sides of the stairs and use them. Fix handrails that are broken or loose. Make sure that handrails are as long as the stairways. Check any carpeting to make sure that it is firmly attached to the stairs. Fix any carpet that is loose or worn. Avoid having throw rugs at the top or bottom of the stairs. If you do have throw rugs, attach them to the floor with carpet tape. Make sure that you have a light switch at the top of the stairs and the bottom of the stairs. If you do  not have them, ask someone to add them for you. What else can I do to help prevent falls? Wear shoes that: Do not have high heels. Have rubber bottoms. Are comfortable and fit you well. Are closed at the toe. Do not wear sandals. If you use a stepladder: Make sure that it is fully opened. Do not climb a closed stepladder. Make sure that both sides of the stepladder are locked into place. Ask someone to hold it for you, if possible. Clearly mark and make sure that you can see: Any grab bars or handrails. First and last steps. Where the edge of each step is. Use tools that help you move around (mobility aids) if they are needed. These include: Canes. Walkers. Scooters. Crutches. Turn on the lights when you go into a dark area. Replace any light bulbs as soon as they burn out. Set up your  furniture so you have a clear path. Avoid moving your furniture around. If any of your floors are uneven, fix them. If there are any pets around you, be aware of where they are. Review your medicines with your doctor. Some medicines can make you feel dizzy. This can increase your chance of falling. Ask your doctor what other things that you can do to help prevent falls. This information is not intended to replace advice given to you by your health care provider. Make sure you discuss any questions you have with your health care provider. Document Released: 01/08/2009 Document Revised: 08/20/2015 Document Reviewed: 04/18/2014 Elsevier Interactive Patient Education  2017 [employer].

## 2025-01-30 ENCOUNTER — Ambulatory Visit
